# Patient Record
Sex: Male | Born: 1955 | Race: White | Hispanic: No | Smoking: Current every day smoker
Health system: Southern US, Community
[De-identification: ages and names within clinical notes are randomized; demographics above are authoritative.]

## PROBLEM LIST (undated history)

## (undated) DIAGNOSIS — N400 Enlarged prostate without lower urinary tract symptoms: Secondary | ICD-10-CM

## (undated) DIAGNOSIS — I714 Abdominal aortic aneurysm, without rupture, unspecified: Secondary | ICD-10-CM

## (undated) DIAGNOSIS — E0590 Thyrotoxicosis, unspecified without thyrotoxic crisis or storm: Secondary | ICD-10-CM

---

## 2017-06-02 ENCOUNTER — Encounter (INDEPENDENT_AMBULATORY_CARE_PROVIDER_SITE_OTHER): Payer: Self-pay

## 2017-06-02 ENCOUNTER — Ambulatory Visit (INDEPENDENT_AMBULATORY_CARE_PROVIDER_SITE_OTHER): Payer: Managed Care, Other (non HMO) | Admitting: Podiatry

## 2017-06-02 ENCOUNTER — Encounter: Payer: Self-pay | Admitting: Podiatry

## 2017-06-02 VITALS — BP 122/67 | HR 77 | Ht 72.0 in | Wt 210.0 lb

## 2017-06-02 DIAGNOSIS — G5761 Lesion of plantar nerve, right lower limb: Secondary | ICD-10-CM | POA: Diagnosis not present

## 2017-06-02 DIAGNOSIS — G5781 Other specified mononeuropathies of right lower limb: Secondary | ICD-10-CM

## 2017-06-02 NOTE — Progress Notes (Signed)
° °  Subjective:    Patient ID: Christopher Ellison, male    DOB: April 07, 1956, 61 y.o.   MRN: [id number]  HPI    Review of Systems  HENT: Positive for tinnitus.   Respiratory: Positive for cough and wheezing.   Genitourinary: Positive for difficulty urinating.  Musculoskeletal: Positive for gait problem.  Neurological: Positive for numbness.  Hematological: Bruises/bleeds easily.  All other systems reviewed and are negative.      Objective:   Physical Exam        Assessment & Plan:

## 2017-06-03 ENCOUNTER — Other Ambulatory Visit (HOSPITAL_COMMUNITY): Payer: Self-pay | Admitting: Urology

## 2017-06-03 DIAGNOSIS — R972 Elevated prostate specific antigen [PSA]: Secondary | ICD-10-CM

## 2017-06-12 ENCOUNTER — Ambulatory Visit (HOSPITAL_COMMUNITY): Payer: Self-pay

## 2017-06-15 ENCOUNTER — Ambulatory Visit (HOSPITAL_COMMUNITY)
Admission: RE | Admit: 2017-06-15 | Discharge: 2017-06-15 | Disposition: A | Discharge status: Home / Self Care | Payer: Managed Care, Other (non HMO) | Source: Ambulatory Visit | Attending: Urology | Admitting: Urology

## 2017-06-15 DIAGNOSIS — R972 Elevated prostate specific antigen [PSA]: Secondary | ICD-10-CM | POA: Diagnosis present

## 2017-06-15 DIAGNOSIS — N400 Enlarged prostate without lower urinary tract symptoms: Secondary | ICD-10-CM | POA: Insufficient documentation

## 2017-06-15 LAB — POCT I-STAT CREATININE: Creatinine, Ser: 0.8 mg/dL (ref 0.61–1.24)

## 2017-06-15 MED ORDER — LIDOCAINE HCL 2 % EX GEL
CUTANEOUS | Status: AC
  Filled 2017-06-15: qty 30

## 2017-06-15 MED ORDER — LIDOCAINE HCL 2 % EX GEL: 1.0000 "application " | Freq: Once | CUTANEOUS | Status: DC

## 2017-06-15 MED ORDER — GADOBENATE DIMEGLUMINE 529 MG/ML IV SOLN
20.0000 mL | Freq: Once | INTRAVENOUS | Status: AC | PRN
  Administered 2017-06-15: 20 mL via INTRAVENOUS

## 2017-06-21 NOTE — Progress Notes (Signed)
  Subjective:  Patient ID: Christopher Ellison, male    DOB: 09-06-1955,  MRN: [id number]  Chief Complaint  Patient presents with  . Nail Problem    Thick nails, 2nd nail on left looks ingrown. Requested for Pt to bring med list at next visit  . Callouses    callous on bottom of right foot, pt states he thinks the callous makes his 2 and 3 toes feel numb    61 y.o. male presents with the above complaint.  Planes of callus on the bottom of the right foot.  States that he feels that his second and third toes are numb.  Complains of an ingrown nail to the left second toe. No past medical history on file.  No current outpatient medications on file.  No Known Allergies Review of Systems Objective:   Vitals:   06/02/17 1538  BP: 122/67  Pulse: 77    General AA&O x3. Normal mood and affect.  Vascular Dorsalis pedis and posterior tibial pulses  present 2+ bilaterally  Capillary refill normal to all digits. Pedal hair growth normal.  Neurologic Epicritic sensation grossly intact. Positive Tinel's sign 2nd interspace right   Dermatologic No open lesions. Interspaces clear of maceration. L 2nd toenail ingrown.  Orthopedic: MMT 5/5 in dorsiflexion, plantarflexion, inversion, and eversion. Normal joint ROM without pain or crepitus. Positive Mulder's click 2nd interspace right    Assessment & Plan:  Patient was evaluated and treated and all questions answered.  Neuroma -Injection delivered R 2nd interspace -Pads dispensed.  Procedure: Neuroma Injection Location: Right 2nd interspace Skin Prep: Alcohol. Injectate: 0.5 cc 0.5% marcaine plain, 0.5 cc dexamethasone phosphate. Disposition: Patient tolerated procedure well. Injection site dressed with a band-aid.  Return in about 3 weeks (around 06/23/2017), or neuroma.

## 2017-06-30 ENCOUNTER — Ambulatory Visit (INDEPENDENT_AMBULATORY_CARE_PROVIDER_SITE_OTHER): Payer: Managed Care, Other (non HMO) | Admitting: Podiatry

## 2017-06-30 DIAGNOSIS — M2041 Other hammer toe(s) (acquired), right foot: Secondary | ICD-10-CM

## 2017-07-23 ENCOUNTER — Encounter: Payer: Self-pay | Admitting: Podiatry

## 2017-07-23 ENCOUNTER — Ambulatory Visit (INDEPENDENT_AMBULATORY_CARE_PROVIDER_SITE_OTHER): Payer: Managed Care, Other (non HMO) | Admitting: Podiatry

## 2017-07-23 DIAGNOSIS — G5761 Lesion of plantar nerve, right lower limb: Secondary | ICD-10-CM | POA: Diagnosis not present

## 2017-07-23 DIAGNOSIS — M2041 Other hammer toe(s) (acquired), right foot: Secondary | ICD-10-CM | POA: Diagnosis not present

## 2017-07-23 DIAGNOSIS — G5781 Other specified mononeuropathies of right lower limb: Secondary | ICD-10-CM

## 2017-07-23 DIAGNOSIS — M2042 Other hammer toe(s) (acquired), left foot: Secondary | ICD-10-CM

## 2017-07-25 NOTE — Progress Notes (Signed)
  Subjective:  Patient ID: Christopher Ellison, male    DOB: [birth year],  MRN: [id number]  Chief Complaint  Patient presents with  . Foot Problem    i am doing better and i do have the spacer that helps alot   61 y.o. male returns for the above complaint.  States that his feet feel a lot better.  Is using a toe spacer for his left fifth toe that he says works great.  Still reports some pain from his hammertoes.  States that on his right foot his toes sometimes go numb but is not as bad  Objective:  There were no vitals filed for this visit. General AA&O x3. Normal mood and affect.  Vascular Pedal pulses palpable.  Neurologic Epicritic sensation grossly intact.  Dermatologic No open lesions. Skin normal texture and turgor.  Orthopedic: No pain to palpation either foot.   Assessment & Plan:  Patient was evaluated and treated and all questions answered.  Neuroma right foot -Improved. -No injection today  Hammertoes Bilat -Discussed with patient that should pain ever become worse would consider surgical intervention  Follow-up PRN

## 2017-07-26 NOTE — Progress Notes (Signed)
  Subjective:  Patient ID: CHIMA ASTORINO, male    DOB: 1956/05/04,  MRN: [id number]  Chief Complaint  Patient presents with  . Callouses    callus right foot is very painful    61 y.o. male returns for the above complaint.  States his right foot is doing quite a bit.  Also reports left fifth toe pain says that the gel He was given actually makes his toe hurt worse.  Objective:  There were no vitals filed for this visit. General AA&O x3. Normal mood and affect.  Vascular Pedal pulses palpable.  Neurologic Epicritic sensation grossly intact.  Dermatologic No open lesions. Skin normal texture and turgor.  Orthopedic:  Pain to palpation right third interspace   Assessment & Plan:  Patient was evaluated and treated and all questions answered.  Neuroma R 3rd interspace. -Injection delivered as below.  Procedure: Neuroma Injection Location: Right 3rd interspace Skin Prep: Alcohol. Injectate: 0.5 cc 0.5% marcaine plain, 0.5 cc dexamethasone phosphate. Disposition: Patient tolerated procedure well. Injection site dressed with a band-aid.  5th Toe hammertoe -Discussed padding.  Discussed that should the pain persist would consider surgical intervention.  No Follow-up on file.

## 2019-05-17 DIAGNOSIS — Z01818 Encounter for other preprocedural examination: Secondary | ICD-10-CM

## 2020-12-28 ENCOUNTER — Inpatient Hospital Stay (HOSPITAL_COMMUNITY)
Admission: EM | Admit: 2020-12-28 | Discharge: 2020-12-30 | DRG: 377 | Disposition: A | Discharge status: Home / Self Care | Payer: 59 | Source: Other Acute Inpatient Hospital | Attending: Family Medicine | Admitting: Family Medicine

## 2020-12-28 ENCOUNTER — Encounter (HOSPITAL_COMMUNITY): Payer: Self-pay | Admitting: Internal Medicine

## 2020-12-28 DIAGNOSIS — Z720 Tobacco use: Secondary | ICD-10-CM | POA: Diagnosis not present

## 2020-12-28 DIAGNOSIS — K264 Chronic or unspecified duodenal ulcer with hemorrhage: Principal | ICD-10-CM | POA: Diagnosis present

## 2020-12-28 DIAGNOSIS — K5710 Diverticulosis of small intestine without perforation or abscess without bleeding: Secondary | ICD-10-CM | POA: Diagnosis present

## 2020-12-28 DIAGNOSIS — E039 Hypothyroidism, unspecified: Secondary | ICD-10-CM | POA: Diagnosis present

## 2020-12-28 DIAGNOSIS — D500 Iron deficiency anemia secondary to blood loss (chronic): Secondary | ICD-10-CM

## 2020-12-28 DIAGNOSIS — N179 Acute kidney failure, unspecified: Secondary | ICD-10-CM | POA: Diagnosis present

## 2020-12-28 DIAGNOSIS — N19 Unspecified kidney failure: Secondary | ICD-10-CM | POA: Diagnosis not present

## 2020-12-28 DIAGNOSIS — K449 Diaphragmatic hernia without obstruction or gangrene: Secondary | ICD-10-CM | POA: Diagnosis present

## 2020-12-28 DIAGNOSIS — I959 Hypotension, unspecified: Secondary | ICD-10-CM | POA: Diagnosis present

## 2020-12-28 DIAGNOSIS — U071 COVID-19: Secondary | ICD-10-CM | POA: Diagnosis present

## 2020-12-28 DIAGNOSIS — N400 Enlarged prostate without lower urinary tract symptoms: Secondary | ICD-10-CM | POA: Diagnosis present

## 2020-12-28 DIAGNOSIS — I714 Abdominal aortic aneurysm, without rupture: Secondary | ICD-10-CM | POA: Diagnosis present

## 2020-12-28 DIAGNOSIS — K922 Gastrointestinal hemorrhage, unspecified: Secondary | ICD-10-CM | POA: Diagnosis present

## 2020-12-28 DIAGNOSIS — D62 Acute posthemorrhagic anemia: Secondary | ICD-10-CM | POA: Diagnosis present

## 2020-12-28 DIAGNOSIS — Z28310 Unvaccinated for covid-19: Secondary | ICD-10-CM | POA: Diagnosis not present

## 2020-12-28 HISTORY — DX: Thyrotoxicosis, unspecified without thyrotoxic crisis or storm: E05.90

## 2020-12-28 HISTORY — DX: Benign prostatic hyperplasia without lower urinary tract symptoms: N40.0

## 2020-12-28 HISTORY — DX: Abdominal aortic aneurysm, without rupture: I71.4

## 2020-12-28 HISTORY — DX: Abdominal aortic aneurysm, without rupture, unspecified: I71.40

## 2020-12-28 LAB — HEMOGLOBIN AND HEMATOCRIT, BLOOD
HCT: 24.1 % — ABNORMAL LOW (ref 39.0–52.0)
Hemoglobin: 7.6 g/dL — ABNORMAL LOW (ref 13.0–17.0)

## 2020-12-28 MED ORDER — SODIUM CHLORIDE 0.9 % IV SOLN
8.0000 mg/h | INTRAVENOUS | Status: DC
  Administered 2020-12-29 (×2): 8 mg/h via INTRAVENOUS
  Filled 2020-12-28 (×2): qty 80

## 2020-12-28 MED ORDER — ONDANSETRON HCL 4 MG/2ML IJ SOLN: 4.0000 mg | Freq: Four times a day (QID) | INTRAMUSCULAR | Status: DC | PRN

## 2020-12-28 MED ORDER — LACTATED RINGERS IV SOLN: INTRAVENOUS | Status: DC

## 2020-12-28 MED ORDER — ACETAMINOPHEN 650 MG RE SUPP: 650.0000 mg | Freq: Four times a day (QID) | RECTAL | Status: DC | PRN

## 2020-12-28 MED ORDER — ONDANSETRON HCL 4 MG PO TABS: 4.0000 mg | ORAL_TABLET | Freq: Four times a day (QID) | ORAL | Status: DC | PRN

## 2020-12-28 MED ORDER — SODIUM CHLORIDE 0.9 % IV SOLN
80.0000 mg | Freq: Once | INTRAVENOUS | Status: AC
  Administered 2020-12-29: 80 mg via INTRAVENOUS
  Filled 2020-12-28: qty 80

## 2020-12-28 MED ORDER — ACETAMINOPHEN 325 MG PO TABS
650.0000 mg | ORAL_TABLET | Freq: Four times a day (QID) | ORAL | Status: DC | PRN
  Administered 2020-12-29 (×2): 650 mg via ORAL
  Filled 2020-12-28 (×2): qty 2

## 2020-12-28 MED ORDER — SODIUM CHLORIDE 0.9 % IV SOLN
500.0000 mg | Freq: Once | INTRAVENOUS | Status: AC
  Administered 2020-12-29: 500 mg via INTRAVENOUS
  Filled 2020-12-28: qty 10

## 2020-12-28 NOTE — H&P (Addendum)
History and Physical    Christopher Ellison AST:[id number] DOB: 04-Jun-1956 DOA: 12/28/2020  PCP: Nicoletta Dress, MD   Patient coming from:  [facility] ER in transfer  Chief Complaint: dark stools  HPI: Christopher Ellison is a 65 y.o. male with medical history significant for hypothyroidism and BPH. Presented to [facility] ER on 12/26/20 with complaint of dark black stools for 1-2 days. He reports he had initial mild abdominal pain which has resolved. He initially had low blood pressure in the 80-90/50's which resolved with IVF hydration. He was Hemoccult positive in the emergency room.  Hemoglobin was 11.5.  He had a CT angiography of the abdomen that showed no active bleeding.  He reports he has never had GI bleeding or gastric ulcers in the past.  He states he has never had a colonoscopy or upper endoscopy.  He denies any injury or trauma to his abdomen.  There is no GI coverage at [facility] so patient was transferred to [facility] for further evaluation.  Reports he has not had any change in his diet activity level.  He initially had AKI on his labs with a BUN of 112 and creatinine of 1.60.  AKI has improved in the morning of June 3 creatinine was 0.80.  Patient was incidentally found to have COVID 19 infection.  He has no symptoms.  He did not receive a COVID-vaccine.  He was not treated while at [facility] as he has no symptoms and oxygen requirement and no risk factors for worsening disease.  Denies any known exposures to COVID-19. On the evening of December 28, 2020 bed became available at [facility] patient was transferred for further evaluation by GI.   Review of Systems:  General: Denies weakness, fever, chills, weight loss, night sweats.  Denies dizziness.  Denies change in appetite HENT: Denies head trauma, headache, denies change in hearing, tinnitus.  Denies nasal congestion or bleeding.  Denies sore throat, sores in mouth.  Denies difficulty swallowing Eyes:  Denies blurry vision, pain in eye, drainage.  Denies discoloration of eyes. Neck: Denies pain.  Denies swelling.  Denies pain with movement. Cardiovascular: Denies chest pain, palpitations.  Denies edema.  Denies orthopnea Respiratory: Denies shortness of breath, cough.  Denies wheezing.  Denies sputum production Gastrointestinal: Reports melena. Reports mild abdominal pain. Denies nausea, vomiting, diarrhea. Denies hematemesis. Musculoskeletal: Denies limitation of movement.  Denies deformity or swelling.  Denies pain.  Denies arthralgias or myalgias. Genitourinary: Denies pelvic pain.  Denies urinary frequency or hesitancy.  Denies dysuria.  Skin: Denies rash.  Denies petechiae, purpura, ecchymosis. Neurological: Denies syncope.  Denies seizure activity.  Denies paresthesia.  Denies slurred speech, drooping face.  Denies visual change. Psychiatric: Denies depression, anxiety. Denies hallucinations.  History reviewed. No pertinent past medical history.  History reviewed. No pertinent surgical history.  Social History  reports that he has been smoking. He has never used smokeless tobacco. He reports that he does not drink alcohol and does not use drugs.  No Known Allergies  History reviewed. No pertinent family history.   Prior to Admission medications   Not on File    Physical Exam: Vitals:   12/28/20 2118  BP: 125/77  Pulse: 72  Resp: 16  Temp: 98.1 F (36.7 C)  TempSrc: Oral  SpO2: 98%  Weight: 99.5 kg  Height: 6' (1.829 m)    Constitutional: NAD, calm, comfortable Vitals:   12/28/20 2118  BP: 125/77  Pulse: 72  Resp: 16  Temp: 98.1 F (36.7 C)  TempSrc: Oral  SpO2: 98%  Weight: 99.5 kg  Height: 6' (1.829 m)   General: WDWN, Alert and oriented x3.  Eyes: EOMI, PERRL,  conjunctivae pale pink color.  Sclera nonicteric HENT:  [place]/AT, external ears normal.  Nares patent without epistasis.  Mucous membranes are moist. Posterior pharynx clear of any exudate or  lesions. Neck: Soft, normal range of motion, supple, no masses, no thyromegaly.  Trachea midline Respiratory: clear to auscultation bilaterally, no wheezing, no crackles. Normal respiratory effort. No accessory muscle use.  Cardiovascular: Regular rate and rhythm, no murmurs / rubs / gallops. No extremity edema. 2+ pedal pulses Abdomen: Soft, no tenderness, nondistended, no rebound or guarding.  No masses palpated.  Bowel sounds normoactive Musculoskeletal: FROM. no cyanosis. No joint deformity upper and lower extremities. Normal muscle tone.  Skin: Warm, dry, intact no rashes, lesions, ulcers. No induration Neurologic: CN 2-12 grossly intact.  Normal speech.  Sensation intact, patella DTR +1 bilaterally. Strength 5/5 in all extremities.   Psychiatric: Normal judgment and insight.  Normal mood.    Labs on Admission: I have personally reviewed following labs and imaging studies  CBC: No results for input(s): WBC, NEUTROABS, HGB, HCT, MCV, PLT in the last 168 hours.  Basic Metabolic Panel: No results for input(s): NA, K, CL, CO2, GLUCOSE, BUN, CREATININE, CALCIUM, MG, PHOS in the last 168 hours.  GFR: CrCl cannot be calculated (Patient's most recent lab result is older than the maximum 21 days allowed.).  Liver Function Tests: No results for input(s): AST, ALT, ALKPHOS, BILITOT, PROT, ALBUMIN in the last 168 hours.  Urine analysis: No results found for: COLORURINE, APPEARANCEUR, LABSPEC, PHURINE, GLUCOSEU, HGBUR, BILIRUBINUR, KETONESUR, PROTEINUR, UROBILINOGEN, NITRITE, LEUKOCYTESUR  Radiological Exams on Admission: No results found.  EKG: Independently reviewed. EKG from [facility] shows NSR with no ST elevation or depression.   Assessment/Plan Principal Problem:   Acute GI bleeding Christopher Ellison is admitted to med/surg floor.  Consult GI for evaluation and endoscopy.  Check Hgb/Hct now. If Hgb continues to descend will transfuse with PRBC. This is discussed with Christopher Ellison  and he agrees with this plan. Initial Hgb was 11.5 on 12/26/20 and dropped to 8.4 on 12/28/20.  IVF hydration with LR.  Monitor BP and vital signs.  Pt had CTA abdomen at [facility] that did not identify active bleeding.   Active Problems:   Anemia due to blood loss Secondary to GI blood loss. Monitor Hgb level.    AKI (acute kidney injury)  Has improved with IVF over past two days.  Continue IVF hydration.  Check electrolytes and renal function in am     Covid Infection Incidental finding. Pt has no symptoms. Supportive care    Hypothyroidism Chronic   DVT prophylaxis: SCDs for DVT prophylaxis.  No anticoagulation with acute GI bleeding Code Status:   Full code Family Communication:  Diagnosis and plan discussed with patient.  Patient verbalized understanding agrees with plan.  Further recommendations to follow as clinically indicated Disposition Plan:   Patient is from:  Home via [facility] emergency room transfer  Anticipated DC to:  Home  Anticipated DC date:  Anticipate 2 midnight or more stay  Anticipated DC barriers: No barriers to discharge identified at this time  Consults called:  Gastroenterology, Dr. Rush Landmark with [facility]. Secure chat sent.  Admission status:  Inpatient    Christopher Ellison Emilian Stawicki MD [facility]  How to contact the [facility] Attending or Consulting provider  7A - 7P or covering provider during after hours [place], for this patient?   1. Check the care team in [facility] and look for a) attending/consulting TRH provider listed and b) the [facility] team listed 2. Log into [email or web address] and use [facility]'s universal password to access. If you do not have the password, please contact the hospital operator. 3. Locate the [facility] provider you are looking for under [facility] and page to a number that you can be directly reached. 4. If you still have difficulty reaching the provider, please page the [facility] (Director on Call) for the Hospitalists listed  on amion for assistance.  12/28/2020, 10:16 PM     Addendum: Hgb is down to 7.6. Transfuse one unit of PRBC with GI bleeding and falling Hgb over past 24 hours.

## 2020-12-29 ENCOUNTER — Encounter (HOSPITAL_COMMUNITY): Payer: Self-pay | Admitting: Family Medicine

## 2020-12-29 ENCOUNTER — Inpatient Hospital Stay (HOSPITAL_COMMUNITY): Payer: 59 | Admitting: Certified Registered Nurse Anesthetist

## 2020-12-29 ENCOUNTER — Encounter (HOSPITAL_COMMUNITY)
Admission: EM | Disposition: A | Discharge status: Home / Self Care | Payer: Self-pay | Source: Other Acute Inpatient Hospital | Attending: Student

## 2020-12-29 ENCOUNTER — Other Ambulatory Visit: Payer: Self-pay

## 2020-12-29 DIAGNOSIS — E039 Hypothyroidism, unspecified: Secondary | ICD-10-CM

## 2020-12-29 DIAGNOSIS — N19 Unspecified kidney failure: Secondary | ICD-10-CM

## 2020-12-29 DIAGNOSIS — N179 Acute kidney failure, unspecified: Secondary | ICD-10-CM

## 2020-12-29 DIAGNOSIS — K5710 Diverticulosis of small intestine without perforation or abscess without bleeding: Secondary | ICD-10-CM

## 2020-12-29 DIAGNOSIS — D500 Iron deficiency anemia secondary to blood loss (chronic): Secondary | ICD-10-CM

## 2020-12-29 DIAGNOSIS — K264 Chronic or unspecified duodenal ulcer with hemorrhage: Secondary | ICD-10-CM

## 2020-12-29 HISTORY — PX: ESOPHAGOGASTRODUODENOSCOPY: SHX5428

## 2020-12-29 HISTORY — PX: BIOPSY: SHX5522

## 2020-12-29 LAB — BASIC METABOLIC PANEL
Anion gap: 6 (ref 5–15)
BUN: 34 mg/dL — ABNORMAL HIGH (ref 8–23)
CO2: 22 mmol/L (ref 22–32)
Calcium: 8.5 mg/dL — ABNORMAL LOW (ref 8.9–10.3)
Chloride: 110 mmol/L (ref 98–111)
Creatinine, Ser: 1.01 mg/dL (ref 0.61–1.24)
GFR, Estimated: >60 mL/min (ref >60)
Glucose, Bld: 95 mg/dL (ref 70–99)
Potassium: 3.9 mmol/L (ref 3.5–5.1)
Sodium: 138 mmol/L (ref 135–145)

## 2020-12-29 LAB — CBC
HCT: 23 % — ABNORMAL LOW (ref 39.0–52.0)
Hemoglobin: 7.4 g/dL — ABNORMAL LOW (ref 13.0–17.0)
MCH: 29.8 pg (ref 26.0–34.0)
MCHC: 32.2 g/dL (ref 30.0–36.0)
MCV: 92.7 fL (ref 80.0–100.0)
Platelets: 207 10*3/uL (ref 150–400)
RBC: 2.48 MIL/uL — ABNORMAL LOW (ref 4.22–5.81)
RDW: 13.9 % (ref 11.5–15.5)
WBC: 6.2 10*3/uL (ref 4.0–10.5)
nRBC: 0 % (ref 0.0–0.2)

## 2020-12-29 LAB — GLUCOSE, CAPILLARY: Glucose-Capillary: 95 mg/dL (ref 70–99)

## 2020-12-29 LAB — IRON AND TIBC
Iron: 54 ug/dL (ref 45–182)
Saturation Ratios: 20 % (ref 17.9–39.5)
TIBC: 267 ug/dL (ref 250–450)
UIBC: 213 ug/dL

## 2020-12-29 LAB — HEMOGLOBIN AND HEMATOCRIT, BLOOD
HCT: 22.9 % — ABNORMAL LOW (ref 39.0–52.0)
HCT: 26.5 % — ABNORMAL LOW (ref 39.0–52.0)
HCT: 27.2 % — ABNORMAL LOW (ref 39.0–52.0)
Hemoglobin: 7.3 g/dL — ABNORMAL LOW (ref 13.0–17.0)
Hemoglobin: 8.4 g/dL — ABNORMAL LOW (ref 13.0–17.0)
Hemoglobin: 8.7 g/dL — ABNORMAL LOW (ref 13.0–17.0)

## 2020-12-29 LAB — RETICULOCYTES
Immature Retic Fract: 21 % — ABNORMAL HIGH (ref 2.3–15.9)
RBC.: 2.46 MIL/uL — ABNORMAL LOW (ref 4.22–5.81)
Retic Count, Absolute: 82.2 10*3/uL (ref 19.0–186.0)
Retic Ct Pct: 3.3 % — ABNORMAL HIGH (ref 0.4–3.1)

## 2020-12-29 LAB — PREPARE RBC (CROSSMATCH)

## 2020-12-29 LAB — HIV ANTIBODY (ROUTINE TESTING W REFLEX): HIV Screen 4th Generation wRfx: NONREACTIVE

## 2020-12-29 LAB — FOLATE: Folate: 14.6 ng/mL (ref >5.9)

## 2020-12-29 LAB — VITAMIN B12: Vitamin B-12: 235 pg/mL (ref 180–914)

## 2020-12-29 LAB — FERRITIN: Ferritin: 154 ng/mL (ref 24–336)

## 2020-12-29 LAB — ABO/RH: ABO/RH(D): B POS

## 2020-12-29 SURGERY — EGD (ESOPHAGOGASTRODUODENOSCOPY)
Anesthesia: Monitor Anesthesia Care

## 2020-12-29 MED ORDER — SODIUM CHLORIDE 0.9% IV SOLUTION: Freq: Once | INTRAVENOUS | Status: AC

## 2020-12-29 MED ORDER — OXYCODONE-ACETAMINOPHEN 5-325 MG PO TABS
1.0000 | ORAL_TABLET | Freq: Once | ORAL | Status: AC
Start: 2020-12-29 — End: 2020-12-29
  Administered 2020-12-29: 1 via ORAL
  Filled 2020-12-29: qty 1

## 2020-12-29 MED ORDER — SODIUM CHLORIDE 0.9 % IV SOLN: INTRAVENOUS | Status: DC

## 2020-12-29 MED ORDER — ACETAMINOPHEN 325 MG PO TABS
650.0000 mg | ORAL_TABLET | Freq: Once | ORAL | Status: AC
  Administered 2020-12-29: 650 mg via ORAL
  Filled 2020-12-29: qty 2

## 2020-12-29 MED ORDER — LACTATED RINGERS IV SOLN
INTRAVENOUS | Status: AC | PRN
  Administered 2020-12-29: 20 mL/h via INTRAVENOUS

## 2020-12-29 MED ORDER — OXYCODONE-ACETAMINOPHEN 5-325 MG PO TABS
1.0000 | ORAL_TABLET | Freq: Three times a day (TID) | ORAL | Status: DC | PRN
  Administered 2020-12-29 – 2020-12-30 (×2): 1 via ORAL
  Filled 2020-12-29 (×2): qty 1

## 2020-12-29 MED ORDER — DIPHENHYDRAMINE HCL 25 MG PO CAPS
25.0000 mg | ORAL_CAPSULE | Freq: Once | ORAL | Status: AC
  Administered 2020-12-29: 25 mg via ORAL
  Filled 2020-12-29: qty 1

## 2020-12-29 MED ORDER — ZOLPIDEM TARTRATE 5 MG PO TABS
5.0000 mg | ORAL_TABLET | Freq: Every evening | ORAL | Status: DC | PRN
  Administered 2020-12-29: 5 mg via ORAL
  Filled 2020-12-29: qty 1

## 2020-12-29 MED ORDER — PROPOFOL 500 MG/50ML IV EMUL
INTRAVENOUS | Status: DC | PRN
  Administered 2020-12-29: 100 ug/kg/min via INTRAVENOUS

## 2020-12-29 MED ORDER — PANTOPRAZOLE SODIUM 40 MG PO TBEC
40.0000 mg | DELAYED_RELEASE_TABLET | Freq: Two times a day (BID) | ORAL | Status: DC
  Administered 2020-12-29 – 2020-12-30 (×3): 40 mg via ORAL
  Filled 2020-12-29 (×3): qty 1

## 2020-12-29 MED ORDER — PROPOFOL 10 MG/ML IV BOLUS
INTRAVENOUS | Status: DC | PRN
  Administered 2020-12-29: 20 mg via INTRAVENOUS
  Administered 2020-12-29: 30 mg via INTRAVENOUS
  Administered 2020-12-29 (×2): 20 mg via INTRAVENOUS

## 2020-12-29 NOTE — Anesthesia Preprocedure Evaluation (Addendum)
Anesthesia Evaluation  Patient identified by MRN, date of birth, ID band Patient awake    Reviewed: Allergy & Precautions, NPO status , Patient's Chart, lab work & pertinent test results  Airway Mallampati: II  TM Distance: >3 FB Neck ROM: Full    Dental  (+) Teeth Intact, Dental Advisory Given, Chipped,    Pulmonary Current Smoker and Patient abstained from smoking.,    Pulmonary exam normal        Cardiovascular negative cardio ROS   Rhythm:Regular Rate:Normal     Neuro/Psych negative neurological ROS  negative psych ROS   GI/Hepatic negative GI ROS, Neg liver ROS,   Endo/Other  Hyperthyroidism   Renal/GU      Musculoskeletal negative musculoskeletal ROS (+)   Abdominal Normal abdominal exam  (+)   Peds  Hematology   Anesthesia Other Findings   Reproductive/Obstetrics                            Anesthesia Physical Anesthesia Plan  ASA: II  Anesthesia Plan: MAC   Post-op Pain Management:    Induction: Intravenous  PONV Risk Score and Plan: 0 and Propofol infusion  Airway Management Planned: Natural Airway and Nasal Cannula  Additional Equipment: None  Intra-op Plan:   Post-operative Plan:   Informed Consent: I have reviewed the patients History and Physical, chart, labs and discussed the procedure including the risks, benefits and alternatives for the proposed anesthesia with the patient or authorized representative who has indicated his/her understanding and acceptance.       Plan Discussed with: CRNA  Anesthesia Plan Comments:        Anesthesia Quick Evaluation

## 2020-12-29 NOTE — Consult Note (Addendum)
Referring Provider:  Dr. Tonie Griffith Primary Care Physician:  Nicoletta Dress, MD Primary Gastroenterologist:  Althia Forts  Reason for Consultation:  GI bleed/melena  HPI: Christopher Ellison is a 65 y.o. male with medical history significant for hypothyroidism and BPH. He presented to [facility] ER on 12/26/20 with complaint of dark black stools for 1 day. He reports he had initial mild abdominal pain which has resolved. He says that Wednesday morning he woke up with abdominal pain and had a very black stool.  Went to work and had several more episodes.  Felt faint.  Went to [facility] and they sent him to the ED at [facility].  He initially had low blood pressure in the 80-90/50's, which resolved with IVF hydration. He was Hemoccult positive in the emergency room.  Hemoglobin was 11.5 grams.  He had a CT angiography of the abdomen that showed no active bleeding.  He reports he has never had GI bleeding or gastric ulcers in the past.  He states he has never had a colonoscopy or upper endoscopy.  He has been on Celebrex for the past 3 weeks for hip pain.  Has not passed any other stools since getting to the hospital.  No further abdominal pain.  No vomiting/hematemesis.    Hgb 7.4 grams here and was 11.5 grams at [place].  Iron studies normal.  Likely all ABLA due to UGI.  BUN elevated at 34.  Is on PPI gtt and was given a dose of erythromycin to clear the stomach.  Past Medical History:  Diagnosis Date  . AAA (abdominal aortic aneurysm) ([place])   . BPH (benign prostatic hyperplasia)   . Hyperthyroidism     History reviewed. No pertinent surgical history.  Prior to Admission medications   Not on File    Current Facility-Administered Medications  Medication Dose Route Frequency Provider Last Rate Last Admin  . 0.9 %  sodium chloride infusion (Manually program via Guardrails IV Fluids)   Intravenous Once Chotiner, Yevonne Aline, MD      . acetaminophen (TYLENOL) tablet 650 mg  650 mg Oral Q6H PRN  Chotiner, Yevonne Aline, MD   650 mg at 12/29/20 0016   Or  . acetaminophen (TYLENOL) suppository 650 mg  650 mg Rectal Q6H PRN Chotiner, Yevonne Aline, MD      . acetaminophen (TYLENOL) tablet 650 mg  650 mg Oral Once Chotiner, Yevonne Aline, MD      . diphenhydrAMINE (BENADRYL) capsule 25 mg  25 mg Oral Once Chotiner, Yevonne Aline, MD      . lactated ringers infusion   Intravenous Continuous Chotiner, Yevonne Aline, MD 100 mL/hr at 12/29/20 0213 New Bag at 12/29/20 5462  . ondansetron (ZOFRAN) tablet 4 mg  4 mg Oral Q6H PRN Chotiner, Yevonne Aline, MD       Or  . ondansetron (ZOFRAN) injection 4 mg  4 mg Intravenous Q6H PRN Chotiner, Yevonne Aline, MD      . pantoprazole (PROTONIX) 80 mg in sodium chloride 0.9 % 100 mL (0.8 mg/mL) infusion  8 mg/hr Intravenous Continuous Chotiner, Yevonne Aline, MD 10 mL/hr at 12/29/20 0237 8 mg/hr at 12/29/20 0237  . zolpidem (AMBIEN) tablet 5 mg  5 mg Oral QHS PRN Chotiner, Yevonne Aline, MD        Allergies as of 12/27/2020  . (No Known Allergies)    History reviewed. No pertinent family history.  Social History   Socioeconomic History  . Marital status: Widowed    Spouse name: Not  on file  . Number of children: Not on file  . Years of education: Not on file  . Highest education level: Not on file  Occupational History  . Not on file  Tobacco Use  . Smoking status: Current Every Day Smoker  . Smokeless tobacco: Never Used  . Tobacco comment: smokes 5 cigars a day  Substance and Sexual Activity  . Alcohol use: No  . Drug use: No  . Sexual activity: Never  Other Topics Concern  . Not on file  Social History Narrative  . Not on file   Social Determinants of Health   Financial Resource Strain: Not on file  Food Insecurity: Not on file  Transportation Needs: Not on file  Physical Activity: Not on file  Stress: Not on file  Social Connections: Not on file  Intimate Partner Violence: Not on file   Review of Systems: ROS is O/W negative except as mentioned in  HPI.  Physical Exam: Vital signs in last 24 hours: Temp:  [98.1 F (36.7 C)-98.2 F (36.8 C)] 98.2 F (36.8 C) (06/04 0014) Pulse Rate:  [72-81] 81 (06/04 0014) Resp:  [16-18] 18 (06/04 0014) BP: (123-125)/(71-77) 123/71 (06/04 0014) SpO2:  [98 %-99 %] 99 % (06/04 0014) Weight:  [99.5 kg] 99.5 kg (06/03 2118) Last BM Date: 12/27/20 General:  Alert, Well-developed, well-nourished, pleasant and cooperative in NAD Head:  Normocephalic and atraumatic. Eyes:  Sclera clear, no icterus.  Conjunctiva pink. Ears:  Normal auditory acuity. Mouth:  No deformity or lesions.   Lungs:  Clear throughout to auscultation.  No wheezes, crackles, or rhonchi.  Heart:  Regular rate and rhythm; no murmurs, clicks, rubs, or gallops. Abdomen:  Soft, non-distended.  BS present.  Non-tender.   Msk:  Symmetrical without gross deformities. Pulses:  Normal pulses noted. Extremities:  Without clubbing or edema. Neurologic:  Alert and oriented x 4;  grossly normal neurologically. Skin:  Intact without significant lesions or rashes. Psych:  Alert and cooperative. Normal mood and affect.  Intake/Output from previous day: 06/03 0701 - 06/04 0700 In: 771.2 [P.O.:360; I.V.:411.2] Out: 350 [Urine:350]  Lab Results: Recent Labs    12/28/20 2223 12/29/20 0336 12/29/20 0735  WBC  --  6.2  --   HGB 7.6* 7.4* 7.3*  HCT 24.1* 23.0* 22.9*  PLT  --  207  --    BMET Recent Labs    12/29/20 0336  NA 138  K 3.9  CL 110  CO2 22  GLUCOSE 95  BUN 34*  CREATININE 1.01  CALCIUM 8.5*   IMPRESSION:  *UGIB with melena:  Recent NSAID use with celebrex x 3 weeks.  Rule out ulcer disease, etc. *Normocytic anemia:  Hgb 7.4 grams here and was 11.5 grams on presentation at [facility].  Iron studies normal.  Likely all ABLA due to UGI.  BUN elevated at 34. *Incidental Covid 19 infection  PLAN: -EGD later today with Dr. Henrene Pastor. -Continue PPI gtt for now. -Monitory Hgb and transfuse prn.  Laban Emperor. Zehr  12/29/2020,  9:48 AM  GI ATTENDING  History, laboratories, x-rays reviewed.  Patient personally seen and examined.  Agree with comprehensive consultation note as outlined above.  The patient presents with hemodynamically stable but significant upper GI bleed.  Plans for urgent endoscopy today.The nature of the procedure, as well as the risks, benefits, and alternatives were carefully and thoroughly reviewed with the patient. Ample time for discussion and questions allowed. The patient understood, was satisfied, and agreed to proceed.  Jenny Reichmann  Wilford Corner., M.D. [facility] Division of Gastroenterology

## 2020-12-29 NOTE — Anesthesia Postprocedure Evaluation (Signed)
Anesthesia Post Note  Patient: Christopher Ellison  Procedure(s) Performed: ESOPHAGOGASTRODUODENOSCOPY (EGD) (N/A ) BIOPSY     Patient location during evaluation: PACU Anesthesia Type: MAC Level of consciousness: awake and alert Pain management: pain level controlled Vital Signs Assessment: post-procedure vital signs reviewed and stable Respiratory status: spontaneous breathing, nonlabored ventilation, respiratory function stable and patient connected to nasal cannula oxygen Cardiovascular status: stable and blood pressure returned to baseline Postop Assessment: no apparent nausea or vomiting Anesthetic complications: no   No complications documented.  Last Vitals:  Vitals:   12/29/20 1321 12/29/20 1326  BP: 104/78 134/78  Pulse: 75 76  Resp:  (!) 21  Temp:    SpO2: 98% 99%    Last Pain:  Vitals:   12/29/20 1313  TempSrc:   PainSc: 0-No pain                 [name]

## 2020-12-29 NOTE — Op Note (Signed)
[facility] Patient Name: Christopher Ellison Procedure Date : 12/29/2020 MRN: [id number] Attending MD: Docia Chuck. Henrene Pastor , MD Date of Birth: 10/15/1955 CSN: [id number] Age: 65 Admit Type: Inpatient Procedure:                Upper GI endoscopy with biopsies Indications:              Acute post hemorrhagic anemia, Melena. Had been                            taking NSAIDs. Significant drop in hemoglobin. He                            has not had evidence of clinical bleeding in                            several days. Providers:                Docia Chuck. Henrene Pastor, MD, Grace Isaac, RN, Ladona Ridgel, Technician, Clearnce Sorrel, CRNA Referring MD:             [facility] Medicines:                Monitored Anesthesia Care Complications:            No immediate complications. Estimated Blood Loss:     Estimated blood loss: none. Procedure:                Pre-Anesthesia Assessment:                           - Prior to the procedure, a History and Physical                            was performed, and patient medications and                            allergies were reviewed. The patient's tolerance of                            previous anesthesia was also reviewed. The risks                            and benefits of the procedure and the sedation                            options and risks were discussed with the patient.                            All questions were answered, and informed consent                            was obtained. Prior Anticoagulants: The patient has  taken no previous anticoagulant or antiplatelet                            agents. ASA Grade Assessment: II - A patient with                            mild systemic disease. After reviewing the risks                            and benefits, the patient was deemed in                            satisfactory condition to undergo the procedure.                            After obtaining informed consent, the endoscope was                            passed under direct vision. Throughout the                            procedure, the patient's blood pressure, pulse, and                            oxygen saturations were monitored continuously. The                            GIF-H190 ([id number]) Olympus gastroscope was                            introduced through the mouth, and advanced to the                            second part of duodenum. The upper GI endoscopy was                            accomplished without difficulty. The patient                            tolerated the procedure well. Scope In: Scope Out: Findings:      The esophagus was normal.      The stomach was normal save hiatal hernia. Biopsies were taken with a       cold forceps for histology to rule out Helicobacter pylori.      Multiple ulcers were found in the duodenum between the bulb and second       portion. Most of these were superficial. There was a 1 cm clean-based       ulcer without stigmata.      The examined duodenum also revealed a large diverticulum in the second       portion..      The cardia and gastric fundus were normal on retroflexion, save hiatal       hernia. Impression:               1. Recent GI bleed secondary to  duodenal ulcer                            disease as described.                           2. Incidental duodenal diverticulum                           3. Otherwise normal EGD status post antral biopsies Recommendation:           1. Pantoprazole 40 mg p.o. twice daily                           2. Follow-up biopsies (we will)                           3. Regular diet                           4. Avoid all NSAIDs. Advised                           5. Okay for discharge home in a.m. if stable                            overnight                           6. GI follow-up with our office in a couple weeks.                            We will arrange.  We should also discuss screening                            colonoscopy.                           Discussed with the patient. He was provided a copy                            of this report. Please call for questions or                            problems. We will sign off.                           . Procedure Code(s):        --- Professional ---                           [phone], Esophagogastroduodenoscopy, flexible,                            transoral; with biopsy, single or multiple Diagnosis Code(s):        --- Professional ---  K26.9, Duodenal ulcer, unspecified as acute or                            chronic, without hemorrhage or perforation                           D62, Acute posthemorrhagic anemia                           K92.1, Melena (includes Hematochezia) CPT copyright 2019 American Medical Association. All rights reserved. The codes documented in this report are preliminary and upon coder review may  be revised to meet current compliance requirements. Docia Chuck. Henrene Pastor, MD 12/29/2020 1:18:26 PM This report has been signed electronically. Number of Addenda: 0

## 2020-12-29 NOTE — Progress Notes (Signed)
Spoke with Dr. Cyndia Skeeters and MD gave order to discontinue cardiac monitoring.

## 2020-12-29 NOTE — Transfer of Care (Signed)
Immediate Anesthesia Transfer of Care Note  Patient: Christopher Ellison  Procedure(s) Performed: ESOPHAGOGASTRODUODENOSCOPY (EGD) (N/A ) BIOPSY  Patient Location: Endoscopy Unit  Anesthesia Type:MAC  Level of Consciousness: sedated  Airway & Oxygen Therapy: Patient Spontanous Breathing and Patient connected to nasal cannula oxygen  Post-op Assessment: Report given to RN and Post -op Vital signs reviewed and stable  Post vital signs: Reviewed and stable  Last Vitals:  Vitals Value Taken Time  BP    Temp    Pulse    Resp    SpO2      Last Pain:  Vitals:   12/29/20 1231  TempSrc: Oral  PainSc: 0-No pain      Patients Stated Pain Goal: 0 (09/32/35 5732)  Complications: No complications documented.

## 2020-12-29 NOTE — Progress Notes (Signed)
New Admission Note:   Arrival Method: [place] from [facility] - From home with fiance Mental Orientation:  A & O x4 Telemetry: Placed on Tele Box 5M16 - NSR Assessment: Completed Skin:  Intact IV:  Rt AC and Lt FA NSL Pain: See Assessment Tubes:  N/A Safety Measures: Safety Fall Prevention Plan has been given, discussed and signed Admission: Completed 5 MW Orientation: Patient has been orientated to the room, unit and staff.  Family:  None at bedside  Patient is from [facility] with [place].  A & O x4.  Skin is intact.  He has clothing and cell phone at the bed side.  He is aware of [employer] regarding valuables.  He is COVID positive but asymptomatic.  Airborne/Contact Precautions implemented.  Patient understands the reason for the precautions.  Orders have been reviewed and implemented. Will continue to monitor the patient. Call light has been placed within reach and bed alarm has been activated.   Earleen Reaper RN- BC, [facility] Phone number: [phone]

## 2020-12-29 NOTE — Progress Notes (Signed)
PROGRESS NOTE  MAXIM BEDEL QMG:[id number] DOB: 12-19-1955   PCP: Nicoletta Dress, MD  Patient is from: Home.  Independent at baseline.  DOA: 12/28/2020 LOS: 1  Chief complaints: Dark stool   Brief Narrative / Interim history: 65 year old M with history of hypothyroidism, BPH and osteoarthritis presenting to [facility] on 6/1 with complaints of dark black stool for 1 to 2 days, and found to have melanotic stool with hemoglobin of 11.5.  He was hypotensive to 80s/50s in ED but blood pressure improved with hydration.  He is not on blood thinner or NSAID other than Celebrex.  He also had AKI with Cr to 1.60 and BUN of 112.  Incidentally tested positive for COVID-19 as well.  He is not vaccinated.  Never had EGD or colonoscopy.    Patient arrived at [facility] on 6/3 when bed was available.  Hgb 7.6 on arrival.  Patient denies BM since 6/1.  EGD on 6/4 with duodenal ulcer disease (biopsied) and incidental duodenal diverticulum but no other significant finding.  GI recommended PPI twice daily and overnight observation before discharge on 6/5.  GI to arrange outpatient follow-up.  Subjective: Seen and examined earlier this morning before he went down for EGD.  No major events overnight of this morning.  No complaints.  He says he has not bowel movements 6/1.  He denies pain, dizziness, shortness of breath, fatigue, cough, nausea, vomiting or abdominal pain.  Objective: Vitals:   12/29/20 1249 12/29/20 1313 12/29/20 1321 12/29/20 1326  BP: 133/81 124/81 104/78 134/78  Pulse: 74 75 75 76  Resp: 16 (!) 29  (!) 21  Temp:      TempSrc:      SpO2:  99% 98% 99%  Weight:      Height:        Intake/Output Summary (Last 24 hours) at 12/29/2020 1437 Last data filed at 12/29/2020 1304 Gross per 24 hour  Intake 1707.12 ml  Output 350 ml  Net 1357.12 ml   Filed Weights   12/28/20 2118  Weight: 99.5 kg    Examination:  GENERAL: No apparent distress.  Nontoxic. HEENT: MMM.  Vision and  hearing grossly intact.  NECK: Supple.  No apparent JVD.  RESP:  No IWOB.  Fair aeration bilaterally. CVS:  RRR. Heart sounds normal.  ABD/GI/GU: BS+. Abd soft, NTND.  MSK/EXT:  Moves extremities. No apparent deformity. No edema.  SKIN: no apparent skin lesion or wound NEURO: Awake, alert and oriented appropriately.  No apparent focal neuro deficit. PSYCH: Calm. Normal affect.  Procedures:  EGD on 6/4 with duodenal ulcer disease (biopsied) and incidental duodenal diverticulum but no other significant finding.  Microbiology summarized: COVID-19 PCR positive around the hospital.  Assessment & Plan: ABLA due to upper GI bleed likely from duodenal ulcer as noted on EGD: Hgb 11.5 on arrival to [facility].  Anemia panel basically normal.  Bleeding seems to have subsided. Recent Labs    12/28/20 2223 12/29/20 0336 12/29/20 0735  HGB 7.6* 7.4* 7.3*  -Monitor H&H overnight -Protonix 40 mg twice daily -Okay for regular diet per GI -Plan for discharge in the morning if H&H stable -GI to arrange outpatient follow-up in 2 weeks and discuss screening colonoscopy as well  AKI/azotemia-likely due to hypovolemia from bleeding.  Azotemia likely due to upper GI bleed.  Resolved  Hypothyroidism -Continue home Synthroid  History of BPH-followed by urology in [place] home medications  Infrarenal abdominal aneurysms-reportedly noted on CT at [place] follow-up  Body mass index is 29.74 kg/m.         DVT prophylaxis:  SCDs Start: 12/28/20 2214  Code Status: Full code Family Communication: Patient and/or RN. Available if any question.  Level of care: Med-Surg Status is: Inpatient  Remains inpatient appropriate because:Inpatient level of care appropriate due to severity of illness for overnight monitoring of H&H   Dispo: The patient is from: Home              Anticipated d/c is to: Home              Patient currently is not medically stable to d/c.    Difficult to place patient No       Consultants:  Gastroenterology   Sch Meds:  Scheduled Meds: . pantoprazole  40 mg Oral BID   Continuous Infusions: PRN Meds:.acetaminophen **OR** acetaminophen, ondansetron **OR** ondansetron (ZOFRAN) IV, zolpidem  Antimicrobials: Anti-infectives (From admission, onward)   Start     Dose/Rate Route Frequency Ordered Stop   12/28/20 2345  erythromycin 500 mg in sodium chloride 0.9 % 100 mL IVPB        500 mg 100 mL/hr over 60 Minutes Intravenous  Once 12/28/20 2247 12/29/20 0936       I have personally reviewed the following labs and images: CBC: Recent Labs  Lab 12/28/20 2223 12/29/20 0336 12/29/20 0735  WBC  --  6.2  --   HGB 7.6* 7.4* 7.3*  HCT 24.1* 23.0* 22.9*  MCV  --  92.7  --   PLT  --  207  --    BMP &GFR Recent Labs  Lab 12/29/20 0336  NA 138  K 3.9  CL 110  CO2 22  GLUCOSE 95  BUN 34*  CREATININE 1.01  CALCIUM 8.5*   Estimated Creatinine Clearance: 90.3 mL/min (by C-G formula based on SCr of 1.01 mg/dL). Liver & Pancreas: No results for input(s): AST, ALT, ALKPHOS, BILITOT, PROT, ALBUMIN in the last 168 hours. No results for input(s): LIPASE, AMYLASE in the last 168 hours. No results for input(s): AMMONIA in the last 168 hours. Diabetic: No results for input(s): HGBA1C in the last 72 hours. Recent Labs  Lab 12/29/20 0636  GLUCAP 95   Cardiac Enzymes: No results for input(s): CKTOTAL, CKMB, CKMBINDEX, TROPONINI in the last 168 hours. No results for input(s): PROBNP in the last 8760 hours. Coagulation Profile: No results for input(s): INR, PROTIME in the last 168 hours. Thyroid Function Tests: No results for input(s): TSH, T4TOTAL, FREET4, T3FREE, THYROIDAB in the last 72 hours. Lipid Profile: No results for input(s): CHOL, HDL, LDLCALC, TRIG, CHOLHDL, LDLDIRECT in the last 72 hours. Anemia Panel: Recent Labs    12/29/20 0735 12/29/20 0746  VITAMINB12 235  --   FOLATE 14.6  --   FERRITIN 154   --   TIBC 267  --   IRON 54  --   RETICCTPCT  --  3.3*   Urine analysis: No results found for: COLORURINE, APPEARANCEUR, LABSPEC, PHURINE, GLUCOSEU, HGBUR, BILIRUBINUR, KETONESUR, PROTEINUR, UROBILINOGEN, NITRITE, LEUKOCYTESUR Sepsis Labs: Invalid input(s): PROCALCITONIN, [place]  Microbiology: No results found for this or any previous visit (from the past 240 hour(s)).  Radiology Studies: No results found.    Yeni Jiggetts T. [place]  If 7PM-7AM, please contact night-coverage [email or web address] 12/29/2020, 2:37 PM home

## 2020-12-30 ENCOUNTER — Other Ambulatory Visit: Payer: Self-pay

## 2020-12-30 DIAGNOSIS — K922 Gastrointestinal hemorrhage, unspecified: Secondary | ICD-10-CM | POA: Diagnosis not present

## 2020-12-30 LAB — TYPE AND SCREEN
ABO/RH(D): B POS
Antibody Screen: NEGATIVE
Unit division: 0

## 2020-12-30 LAB — CBC
HCT: 23.1 % — ABNORMAL LOW (ref 39.0–52.0)
Hemoglobin: 7.6 g/dL — ABNORMAL LOW (ref 13.0–17.0)
MCH: 30.3 pg (ref 26.0–34.0)
MCHC: 32.9 g/dL (ref 30.0–36.0)
MCV: 92 fL (ref 80.0–100.0)
Platelets: 204 10*3/uL (ref 150–400)
RBC: 2.51 MIL/uL — ABNORMAL LOW (ref 4.22–5.81)
RDW: 14 % (ref 11.5–15.5)
WBC: 5.7 10*3/uL (ref 4.0–10.5)
nRBC: 0 % (ref 0.0–0.2)

## 2020-12-30 LAB — HEMOGLOBIN AND HEMATOCRIT, BLOOD
HCT: 25.9 % — ABNORMAL LOW (ref 39.0–52.0)
HCT: 25.3 % — ABNORMAL LOW (ref 39.0–52.0)
Hemoglobin: 8.4 g/dL — ABNORMAL LOW (ref 13.0–17.0)
Hemoglobin: 8.4 g/dL — ABNORMAL LOW (ref 13.0–17.0)

## 2020-12-30 LAB — BPAM RBC
Blood Product Expiration Date: 202206232359
ISSUE DATE / TIME: 202206041038
Unit Type and Rh: 7300

## 2020-12-30 MED ORDER — PANTOPRAZOLE SODIUM 40 MG PO TBEC: 40.0000 mg | DELAYED_RELEASE_TABLET | Freq: Two times a day (BID) | ORAL | 0 refills | Status: AC

## 2020-12-30 NOTE — Discharge Summary (Signed)
Physician Discharge Summary  Christopher Ellison OMV:[id number] DOB: 23-Feb-1956 DOA: 12/28/2020  PCP: Nicoletta Dress, MD  Admit date: 12/28/2020 Discharge date: 12/30/2020 30 Day Unplanned Readmission Risk Score   Flowsheet Row Admission (Current) from 12/28/2020 in [facility]  30 Day Unplanned Readmission Risk Score (%) 7.1 Filed at 12/30/2020 0801     This score is the patient's risk of an unplanned readmission within 30 days of being discharged (0 -100%). The score is based on dignosis, age, lab data, medications, orders, and past utilization.   Low:  0-14.9   Medium: 15-21.9   High: 22-29.9   Extreme: 30 and above         Admitted From: Home Disposition: Home  Recommendations for Outpatient Follow-up:  1. Follow up with PCP in 1-2 weeks 2. Follow-up with GI in 2 weeks 3. Please obtain BMP/CBC in one week 4. Please follow up with your PCP on the following pending results: Unresulted Labs (From admission, onward)          Start     Ordered   12/29/20 1448  H. pylori antibody, IgG  Once,   R       Question:  Specimen collection method  Answer:  Lab=Lab collect   12/29/20 1447   12/29/20 0735  Hemoglobin and hematocrit, blood  Now then every 6 hours,   R (with TIMED occurrences)      12/29/20 [place]: None Equipment/Devices: None  Discharge Condition: Stable CODE STATUS: Full code Diet recommendation: Cardiac  Subjective: Seen and examined.  No complaints.  Initially reluctant to go home only because he has not had any bowel movement.  He otherwise did not have any abdominal pain or any other signs of GI bleeding.  He was agreeable for discharge after reassurance.  Brief/Interim Summary: 65 year old M with history of hypothyroidism, BPH and osteoarthritis initially presented to [facility] on 6/1 with complaints of dark black stool for 1 to 2 days, and found to have melanotic stool with hemoglobin of 11.5.  He was hypotensive to  80s/50s in ED but blood pressure improved with hydration.  He is not on blood thinner or NSAID other than Celebrex.  He also had AKI with Cr to 1.60 and BUN of 112.  Incidentally tested positive for COVID-19 as well.  He is not vaccinated.  Never had EGD or colonoscopy.    Patient arrived at [facility] on 6/3 when bed was available.  Hgb 7.6 on arrival.  Patient denied BM since 6/1.  He received 1 unit of PRBC transfusion on 12/29/2020. EGD on 6/4 with duodenal ulcer disease (biopsied) and incidental duodenal diverticulum but no other significant finding.  GI recommended PPI twice daily and overnight observation before discharge on 6/5.  GI to arrange outpatient follow-up in 2 weeks.  Patient's hemoglobin improved from 7.6 yesterday to 8.4 today.  No further episodes of black stool or any other evidence of bleeding.  He is medically stable and thus he is going to be discharged on twice daily Protonix.  GI will follow with him regarding H. pylori antibody testing and biopsy results.   Discharge Diagnoses:  Principal Problem:   Acute GI bleeding Active Problems:   Anemia due to blood loss   Hypothyroidism   AKI (acute kidney injury) ([place])   Duodenal ulcer hemorrhage    Discharge Instructions   Allergies as of 12/30/2020   No Known  Allergies     Medication List    TAKE these medications   dutasteride 0.5 MG capsule Commonly known as: AVODART Take 0.5 mg by mouth at bedtime.   levothyroxine 25 MCG tablet Commonly known as: SYNTHROID Take 25 mcg by mouth daily before breakfast.   levothyroxine 200 MCG tablet Commonly known as: SYNTHROID Take 200 mcg by mouth daily before breakfast.   pantoprazole 40 MG tablet Commonly known as: PROTONIX Take 1 tablet (40 mg total) by mouth 2 (two) times daily.       Follow-up Information    Nicoletta Dress, MD Follow up in 1 week(s).   Specialty: Internal Medicine Contact information: [address] [place]  [address] [phone]        Irene Shipper, MD Follow up in 2 week(s).   Specialty: Gastroenterology Contact information: [address] [address] [phone]              No Known Allergies  Consultations: GI   Procedures/Studies:  No results found.   Discharge Exam: Vitals:   12/30/20 0606 12/30/20 0958  BP: 128/80 91/64  Pulse: 64 63  Resp: 18 18  Temp: 98.5 F (36.9 C) 97.7 F (36.5 C)  SpO2: 99% 97%   Vitals:   12/29/20 1725 12/29/20 2113 12/30/20 0606 12/30/20 0958  BP: 107/61 128/62 128/80 91/64  Pulse: 63 66 64 63  Resp: 18 18 18 18   Temp: 98.1 F (36.7 C) 99.9 F (37.7 C) 98.5 F (36.9 C) 97.7 F (36.5 C)  TempSrc: Oral Oral Oral Oral  SpO2: 100% 98% 99% 97%  Weight:      Height:        General: Pt is alert, awake, not in acute distress Cardiovascular: RRR, S1/S2 +, no rubs, no gallops Respiratory: CTA bilaterally, no wheezing, no rhonchi Abdominal: Soft, NT, ND, bowel sounds + Extremities: no edema, no cyanosis    The results of significant diagnostics from this hospitalization (including imaging, microbiology, ancillary and laboratory) are listed below for reference.     Microbiology: No results found for this or any previous visit (from the past 240 hour(s)).   Labs: BNP (last 3 results) No results for input(s): BNP in the last 8760 hours. Basic Metabolic Panel: Recent Labs  Lab 12/29/20 0336  NA 138  K 3.9  CL 110  CO2 22  GLUCOSE 95  BUN 34*  CREATININE 1.01  CALCIUM 8.5*   Liver Function Tests: No results for input(s): AST, ALT, ALKPHOS, BILITOT, PROT, ALBUMIN in the last 168 hours. No results for input(s): LIPASE, AMYLASE in the last 168 hours. No results for input(s): AMMONIA in the last 168 hours. CBC: Recent Labs  Lab 12/29/20 0336 12/29/20 0735 12/29/20 1439 12/29/20 1758 12/30/20 0252 12/30/20 0843  WBC 6.2  --   --   --  5.7  --   HGB 7.4* 7.3* 8.4* 8.7* 7.6* 8.4*  HCT 23.0* 22.9*  26.5* 27.2* 23.1* 25.9*  MCV 92.7  --   --   --  92.0  --   PLT 207  --   --   --  204  --    Cardiac Enzymes: No results for input(s): CKTOTAL, CKMB, CKMBINDEX, TROPONINI in the last 168 hours. BNP: Invalid input(s): POCBNP CBG: Recent Labs  Lab 12/29/20 0636  GLUCAP 95   D-Dimer No results for input(s): DDIMER in the last 72 hours. Hgb A1c No results for input(s): HGBA1C in the last 72  hours. Lipid Profile No results for input(s): CHOL, HDL, LDLCALC, TRIG, CHOLHDL, LDLDIRECT in the last 72 hours. Thyroid function studies No results for input(s): TSH, T4TOTAL, T3FREE, THYROIDAB in the last 72 hours.  Invalid input(s): FREET3 Anemia work up Recent Labs    12/29/20 0735 12/29/20 0746  VITAMINB12 235  --   FOLATE 14.6  --   FERRITIN 154  --   TIBC 267  --   IRON 54  --   RETICCTPCT  --  3.3*   Urinalysis No results found for: COLORURINE, APPEARANCEUR, LABSPEC, [place], GLUCOSEU, HGBUR, BILIRUBINUR, KETONESUR, PROTEINUR, UROBILINOGEN, NITRITE, LEUKOCYTESUR Sepsis Labs Invalid input(s): PROCALCITONIN,  WBC,  LACTICIDVEN Microbiology No results found for this or any previous visit (from the past 240 hour(s)).   Time coordinating discharge: Over 30 minutes  SIGNED:   Darliss Cheney, MD  [facility] 12/30/2020, 10:05 AM  If 7PM-7AM, please contact night-coverage [email or web address]

## 2020-12-30 NOTE — Progress Notes (Signed)
Patient discharged to home via private vehicle by fiance.  Escorted to exit by nurse tech.

## 2020-12-30 NOTE — Discharge Instructions (Signed)

## 2020-12-30 NOTE — Plan of Care (Signed)
  Problem: Clinical Measurements: Goal: Ability to maintain clinical measurements within normal limits will improve Outcome: Completed/Met Goal: Will remain free from infection Outcome: Completed/Met Goal: Diagnostic test results will improve Outcome: Completed/Met Goal: Respiratory complications will improve Outcome: Completed/Met Goal: Cardiovascular complication will be avoided Outcome: Completed/Met   Problem: Activity: Goal: Risk for activity intolerance will decrease Outcome: Completed/Met   Problem: Nutrition: Goal: Adequate nutrition will be maintained Outcome: Completed/Met   Problem: Coping: Goal: Level of anxiety will decrease Outcome: Completed/Met   Problem: Elimination: Goal: Will not experience complications related to bowel motility Outcome: Completed/Met Goal: Will not experience complications related to urinary retention Outcome: Completed/Met   Problem: Pain Managment: Goal: General experience of comfort will improve Outcome: Completed/Met   Problem: Safety: Goal: Ability to remain free from injury will improve Outcome: Completed/Met   Problem: Skin Integrity: Goal: Risk for impaired skin integrity will decrease Outcome: Completed/Met   Problem: Education: Goal: Knowledge of risk factors and measures for prevention of condition will improve Outcome: Completed/Met   Problem: Coping: Goal: Psychosocial and spiritual needs will be supported Outcome: Completed/Met   Problem: Respiratory: Goal: Will maintain a patent airway Outcome: Completed/Met Goal: Complications related to the disease process, condition or treatment will be avoided or minimized Outcome: Completed/Met  Discharge instructions reviewed with patient.  This included, but was not limited to, the following:  Review of hospitalization and plans for follow-up, follow-up MD appointments, when to call the MD, information on COVID and vaccinations, isolation and activity instructions,  prescriptions were also reviewed with patient.  Comprehension of instructions was ascertained via "teach-back" technique.

## 2020-12-31 ENCOUNTER — Encounter (HOSPITAL_COMMUNITY): Payer: Self-pay | Admitting: Internal Medicine

## 2020-12-31 ENCOUNTER — Encounter: Payer: Self-pay | Admitting: Internal Medicine

## 2020-12-31 LAB — H. PYLORI ANTIBODY, IGG: H Pylori IgG: 0.57 Index Value (ref 0.00–0.79)

## 2021-01-01 LAB — SURGICAL PATHOLOGY

## 2021-02-12 ENCOUNTER — Ambulatory Visit: Payer: 59 | Admitting: Internal Medicine

## 2021-04-12 ENCOUNTER — Other Ambulatory Visit: Payer: Self-pay | Admitting: *Deleted

## 2021-04-12 DIAGNOSIS — I83893 Varicose veins of bilateral lower extremities with other complications: Secondary | ICD-10-CM

## 2021-04-15 ENCOUNTER — Other Ambulatory Visit: Payer: Self-pay | Admitting: Family Medicine

## 2021-04-15 DIAGNOSIS — R972 Elevated prostate specific antigen [PSA]: Secondary | ICD-10-CM

## 2021-04-22 ENCOUNTER — Ambulatory Visit (INDEPENDENT_AMBULATORY_CARE_PROVIDER_SITE_OTHER): Payer: 59 | Admitting: Surgery

## 2021-04-22 ENCOUNTER — Other Ambulatory Visit: Payer: Self-pay

## 2021-04-22 ENCOUNTER — Ambulatory Visit (HOSPITAL_COMMUNITY)
Admission: RE | Admit: 2021-04-22 | Discharge: 2021-04-22 | Disposition: A | Discharge status: Home / Self Care | Payer: 59 | Source: Ambulatory Visit | Attending: Surgery | Admitting: Surgery

## 2021-04-22 ENCOUNTER — Encounter: Payer: Self-pay | Admitting: Surgery

## 2021-04-22 VITALS — BP 138/84 | HR 70 | Temp 98.5°F | Resp 20 | Ht 72.0 in | Wt 238.0 lb

## 2021-04-22 DIAGNOSIS — I83893 Varicose veins of bilateral lower extremities with other complications: Secondary | ICD-10-CM | POA: Insufficient documentation

## 2021-04-22 NOTE — Progress Notes (Signed)
Vascular and Vein Specialist of [facility]  Patient name: Christopher Ellison MRN: [id number] DOB: 1955/11/18 Sex: male   REQUESTING PROVIDER:    Paulino Door   REASON FOR CONSULT:    Varicose veins  HISTORY OF PRESENT ILLNESS:   Christopher Ellison is a 65 y.o. male, who comes in today for evaluation of bilateral varicose veins.  He has been dealing with these for a long time.  He underwent right saphenous vein stripping 20 to 30 years ago.  He complains of swelling in his legs as well as pain and heaviness.  He does try to keep his legs elevated.  He began wearing 20-30 knee-high compression stockings at the beginning of the year which have helped.  He denies any history of DVT.  There is a family history of varicosities.  The patient is a current smoker.  He works as a Administrator.  PAST MEDICAL HISTORY    Past Medical History:  Diagnosis Date   AAA (abdominal aortic aneurysm) (HCC)    BPH (benign prostatic hyperplasia)    Hyperthyroidism      FAMILY HISTORY   History reviewed. No pertinent family history.  SOCIAL HISTORY:   Social History   Socioeconomic History   Marital status: Widowed    Spouse name: Not on file   Number of children: Not on file   Years of education: Not on file   Highest education level: Not on file  Occupational History   Not on file  Tobacco Use   Smoking status: Every Day    Types: Cigars   Smokeless tobacco: Never   Tobacco comments:    smokes 5 cigars a day  Vaping Use   Vaping Use: Never used  Substance and Sexual Activity   Alcohol use: No   Drug use: No   Sexual activity: Never  Other Topics Concern   Not on file  Social History Narrative   Not on file   Social Determinants of Health   Financial Resource Strain: Not on file  Food Insecurity: Not on file  Transportation Needs: Not on file  Physical Activity: Not on file  Stress: Not on file  Social Connections: Not on file  Intimate  Partner Violence: Not on file    ALLERGIES:    No Known Allergies  CURRENT MEDICATIONS:    Current Outpatient Medications  Medication Sig Dispense Refill   dutasteride (AVODART) 0.5 MG capsule Take 0.5 mg by mouth at bedtime.     ferrous sulfate 325 (65 FE) MG tablet Take 325 mg by mouth 2 (two) times daily.     levothyroxine (SYNTHROID) 200 MCG tablet Take 200 mcg by mouth daily before breakfast.     levothyroxine (SYNTHROID) 25 MCG tablet Take 25 mcg by mouth daily before breakfast.     pantoprazole (PROTONIX) 40 MG tablet Take 1 tablet (40 mg total) by mouth 2 (two) times daily. 60 tablet 0   No current facility-administered medications for this visit.    REVIEW OF SYSTEMS:   [X]  denotes positive finding, [ ]  denotes negative finding Cardiac  Comments:  Chest pain or chest pressure:    Shortness of breath upon exertion:    Short of breath when lying flat:    Irregular heart rhythm:        Vascular    Pain in calf, thigh, or hip brought on by ambulation:    Pain in feet at night that wakes you up from your sleep:  Blood clot in your veins:    Leg swelling:  x       Pulmonary    Oxygen at home:    Productive cough:     Wheezing:         Neurologic    Sudden weakness in arms or legs:     Sudden numbness in arms or legs:     Sudden onset of difficulty speaking or slurred speech:    Temporary loss of vision in one eye:     Problems with dizziness:         Gastrointestinal    Blood in stool:      Vomited blood:         Genitourinary    Burning when urinating:     Blood in urine:        Psychiatric    Major depression:         Hematologic    Bleeding problems:    Problems with blood clotting too easily:        Skin    Rashes or ulcers:        Constitutional    Fever or chills:     PHYSICAL EXAM:   Vitals:   04/22/21 1106  BP: 138/84  Pulse: 70  Resp: 20  Temp: 98.5 F (36.9 C)  SpO2: 98%  Weight: 238 lb (108 kg)  Height: 6' (1.829 m)     GENERAL: The patient is a well-nourished male, in no acute distress. The vital signs are documented above. CARDIAC: There is a regular rate and rhythm.  VASCULAR: Bilateral edema with hyperpigmentation of the skin on the medial ankle with multiple large varicosities bilaterally particularly on the posterior aspect of the right calf and going up the medial right leg.  On the left leg the pattern is similar. PULMONARY: Nonlabored respirations MUSCULOSKELETAL: There are no major deformities or cyanosis. NEUROLOGIC: No focal weakness or paresthesias are detected. SKIN:  PSYCHIATRIC:    The patient has a normal affect.  STUDIES:   I have reviewed the following studies: Right:  - No evidence of deep vein thrombosis from the common femoral through the  popliteal veins.  - No evidence of superficial venous thrombosis.  - The great saphenous vein was not identified and consistent with history  of stripping.  - The small saphenous vein is not competent.  - Several perforators observed leading to tortuous varicosities.  ASSESSMENT and PLAN   CEAP class IV: The patient is having significant limitations from his varicosities including pain and swelling.  He has had some relief from compression socks (20-30 knee-high).  I evaluated his small saphenous on the right with ultrasound and it appears to be large and relatively straight.  There were numerous varicosities originating from the small saphenous.  This does have reflux on formal ultrasound imaging.  I think he would be an excellent candidate for laser ablation of the small saphenous vein followed by stab phlebectomy of the numerous varicosities around his calf and in the saphenous vein distribution in the medial thigh.  We will work towards Government social research officer.  I have also encouraged him to start wearing thigh-high compression socks.  I have him scheduled for follow-up in 3 months to evaluate the left leg which is having similar  issues.   Leia Alf, MD, FACS Vascular and Vein Specialists of [facility] [phone] Pager [phone]

## 2021-04-24 ENCOUNTER — Other Ambulatory Visit: Payer: Self-pay

## 2021-04-24 DIAGNOSIS — I83893 Varicose veins of bilateral lower extremities with other complications: Secondary | ICD-10-CM

## 2021-05-05 ENCOUNTER — Ambulatory Visit
Admission: RE | Admit: 2021-05-05 | Discharge: 2021-05-05 | Disposition: A | Discharge status: Home / Self Care | Payer: 59 | Source: Ambulatory Visit | Attending: Family Medicine | Admitting: Family Medicine

## 2021-05-05 DIAGNOSIS — R972 Elevated prostate specific antigen [PSA]: Secondary | ICD-10-CM

## 2021-05-05 MED ORDER — GADOBENATE DIMEGLUMINE 529 MG/ML IV SOLN
20.0000 mL | Freq: Once | INTRAVENOUS | Status: AC | PRN
  Administered 2021-05-05: 20 mL via INTRAVENOUS

## 2021-05-21 ENCOUNTER — Other Ambulatory Visit: Payer: Self-pay | Admitting: *Deleted

## 2021-05-21 DIAGNOSIS — I83811 Varicose veins of right lower extremities with pain: Secondary | ICD-10-CM

## 2021-05-29 ENCOUNTER — Other Ambulatory Visit: Payer: Self-pay | Admitting: *Deleted

## 2021-05-29 MED ORDER — LORAZEPAM 1 MG PO TABS: ORAL_TABLET | ORAL | 0 refills | Status: AC

## 2021-06-06 ENCOUNTER — Other Ambulatory Visit: Payer: 59 | Admitting: Surgery

## 2021-06-13 ENCOUNTER — Other Ambulatory Visit: Payer: Self-pay | Admitting: *Deleted

## 2021-06-13 DIAGNOSIS — I83811 Varicose veins of right lower extremities with pain: Secondary | ICD-10-CM

## 2021-06-24 ENCOUNTER — Ambulatory Visit: Payer: 59 | Admitting: Surgery

## 2021-06-24 ENCOUNTER — Encounter (HOSPITAL_COMMUNITY): Payer: 59

## 2021-06-25 ENCOUNTER — Other Ambulatory Visit: Payer: Self-pay | Admitting: *Deleted

## 2021-06-25 MED ORDER — LORAZEPAM 1 MG PO TABS: ORAL_TABLET | ORAL | 0 refills | Status: AC

## 2021-07-01 ENCOUNTER — Ambulatory Visit: Payer: 59 | Admitting: Surgery

## 2021-07-01 ENCOUNTER — Encounter (HOSPITAL_COMMUNITY): Payer: 59

## 2021-07-04 ENCOUNTER — Other Ambulatory Visit: Payer: Self-pay

## 2021-07-04 ENCOUNTER — Encounter: Payer: Self-pay | Admitting: Surgery

## 2021-07-04 ENCOUNTER — Ambulatory Visit (INDEPENDENT_AMBULATORY_CARE_PROVIDER_SITE_OTHER): Payer: 59 | Admitting: Surgery

## 2021-07-04 VITALS — BP 158/91 | HR 69 | Temp 98.4°F | Resp 18 | Ht 72.0 in | Wt 230.0 lb

## 2021-07-04 DIAGNOSIS — I83893 Varicose veins of bilateral lower extremities with other complications: Secondary | ICD-10-CM | POA: Diagnosis not present

## 2021-07-04 HISTORY — PX: ENDOVENOUS ABLATION SAPHENOUS VEIN W/ LASER: SUR449

## 2021-07-04 NOTE — Progress Notes (Signed)
     Laser Ablation Procedure    Date: 07/04/2021   Christopher Ellison DOB:1956/03/25  Consent signed: Yes      Surgeon: Harold Barban MD   Procedure: Laser Ablation: right Small Saphenous Vein  BP (!) 158/91 (BP Location: Right Arm, Patient Position: Sitting, Cuff Size: Large)   Pulse 69   Temp 98.4 F (36.9 C) (Temporal)   Resp 18   Ht 6' (1.829 m)   Wt 230 lb (104.3 kg)   SpO2 99%   BMI 31.19 kg/m   Tumescent Anesthesia: 700 cc 0.9% NaCl with 50 cc Lidocaine HCL 1%  and 15 cc 8.4% NaHCO3  Local Anesthesia: 4 cc Lidocaine HCL and NaHCO3 (ratio 2:1)  7 watts continuous mode     Total energy: 502 Joules    Total time: 71 seconds  Treatment Length  12 cm   Laser Fiber Ref. #  [id number]  Lot # [id number]   Stab Phlebectomy: >20 Sites: Thigh and Calf  Patient tolerated procedure well  Notes: Patient wore face mask.  All staff members wore facial masks and facial shields/goggles.  Christopher Ellison took Ativan 1 mg (1 tablet) on 07-04-2021 at 7:00 AM and 8:15 AM.    Description of Procedure:  After marking the course of the secondary varicosities, the patient was placed on the operating table in the prone position, and the right leg was prepped and draped in sterile fashion.   Local anesthetic was administered and under ultrasound guidance the saphenous vein was accessed with a micro needle and guide wire; then the mirco puncture sheath was placed.  A guide wire was inserted saphenopopliteal junction , followed by a 5 french sheath.  The position of the sheath and then the laser fiber below the junction was confirmed using the ultrasound.  Tumescent anesthesia was administered along the course of the saphenous vein using ultrasound guidance. The patient was placed in Trendelenburg position and protective laser glasses were placed on patient and staff, and the laser was fired at 7 watts continuous mode for a total of 502 joules.   For stab phlebectomies, local anesthetic was administered at  the previously marked varicosities, and tumescent anesthesia was administered around the vessels.  Greater than 20 stab wounds were made using the tip of an 11 blade. And using the vein hook, the phlebectomies were performed using a hemostat to avulse the varicosities.  Adequate hemostasis was achieved.     Steri strips were applied to the stab wounds and ABD pads and thigh high compression stockings were applied.  Ace wrap bandages were applied over the phlebectomy sites and at the top of the saphenopopliteal junction. Blood loss was less than 15 cc.  Discharge instructions reviewed with patient and hardcopy of discharge instructions given to patient to take home. The patient ambulated taken out of the operating room in wheelchair to car having tolerated the procedure well.

## 2021-07-04 NOTE — Progress Notes (Signed)
    Patient name: Christopher Ellison MRN: [id number] DOB: 1956/04/21 Sex: male  Pre-operative Diagnosis: Varicose veins, right leg Post-operative diagnosis:  Same Surgeon:  Annamarie Major Assistants:  Norberto Sorenson Procedure:   #1: Endovenous laser ablation, right small saphenous vein   #2: Stab phlebectomy, greater than 20, right leg Anesthesia: Local   Indications: This is a 65 year old gentleman with CEAP class V venous reflux in the right leg.  He has previously undergoneVein stripping in the right leg.  He has prominent varicosities along the medial upper thigh and in the medial calf.  He is here today for laser ablation and stab phlebectomy.  Procedure: The procedure was performed in the office.  In a standing position, the patient's prominent varicosities were marked with a permanent marker.  The patient was then placed supine on the table and prepped and draped in sterile fashion.  Ultrasound was used to evaluate the small saphenous vein.  It had a normal course with multiple branches from the ankle up to the calf where it connected to the popliteal vein.  I selected a site at the lower border of the gastrocnemius muscle and infiltrated the skin with 1% lidocaine.  The small saphenous vein was then cannulated under ultrasound guidance with a micropuncture needle.  A 018 wire was inserted without resistance.  Ultrasound confirmed that the wire was in the small saphenous vein.  A micropuncture sheath was placed.  Next a 035 J-wire was inserted and able to be directed into the popliteal vein.  Next the laser sheath was inserted.  It was positioned just proximal to where the vein dove down to the popliteal vein.  The laser fiber was then inserted.  It was placed proximal to the posterior deflection of the vein towards the popliteal vein.  This was confirmed with ultrasound.  The tip of the catheter was at 11 cm.  Tumescent anesthesia was then infiltrated.  Endovenous laser ablation was then performed of  the small saphenous vein on the right for a total of 12 cm at 7 W continuous.    Once this was completed, we set up for the stab phlebectomies.  1% lidocaine was used to anesthetize the skin followed by tumescent anesthesia.  Greater than 20 stabs were performed throughout the leg and the previously marked areas where his varicosities existed.  The vein was very friable.  I was unable to get multiple large segments of the vein.  There were some good segments that were removed.  I made sure to disrupt the vein or ever it was marked.  Once we are completed with a stabs, Steri-Strips were placed over the stabs and appropriate postoperative dressing was applied.  There were no complications.    [name], M.D., [facility] Vascular and Vein Specialists of [place] Office: [phone] Pager:  [phone]

## 2021-07-10 ENCOUNTER — Encounter: Payer: 59 | Admitting: Vascular Surgery

## 2021-07-10 ENCOUNTER — Encounter (HOSPITAL_COMMUNITY): Payer: 59

## 2021-07-18 ENCOUNTER — Other Ambulatory Visit: Payer: Self-pay

## 2021-07-18 ENCOUNTER — Ambulatory Visit (INDEPENDENT_AMBULATORY_CARE_PROVIDER_SITE_OTHER): Payer: 59 | Admitting: Vascular Surgery

## 2021-07-18 ENCOUNTER — Encounter: Payer: Self-pay | Admitting: Vascular Surgery

## 2021-07-18 ENCOUNTER — Ambulatory Visit (HOSPITAL_COMMUNITY)
Admission: RE | Admit: 2021-07-18 | Discharge: 2021-07-18 | Disposition: A | Discharge status: Home / Self Care | Payer: 59 | Source: Ambulatory Visit | Attending: Vascular Surgery | Admitting: Vascular Surgery

## 2021-07-18 VITALS — BP 140/78 | HR 66 | Temp 98.1°F | Resp 20 | Ht 72.0 in | Wt 230.0 lb

## 2021-07-18 DIAGNOSIS — I83811 Varicose veins of right lower extremities with pain: Secondary | ICD-10-CM | POA: Diagnosis present

## 2021-07-18 DIAGNOSIS — I83893 Varicose veins of bilateral lower extremities with other complications: Secondary | ICD-10-CM

## 2021-07-18 NOTE — Progress Notes (Signed)
Patient name: Christopher Ellison MRN: [id number] DOB: August 13, 1955 Sex: male  REASON FOR VISIT: Follow-up after endovenous laser ablation of the right small saphenous vein with greater than 20 stabs.  HPI: Christopher Ellison is a 65 y.o. male who had presented with painful varicose veins of the right lower extremity.  He failed conservative treatment.  On 07/04/2021 he underwent laser ablation of the right small saphenous vein with greater than 20 stabs by Dr. Trula Slade.  The vein was cannulated at the lower border of the gastrocnemius muscle and treated to wear the vein began to dive deep to join the popliteal vein.  Today he has no specific complaints.  He had some mild swelling.  He has been wearing his thigh-high compression stocking.  He denies chest pain or shortness of breath.  Current Outpatient Medications  Medication Sig Dispense Refill   dutasteride (AVODART) 0.5 MG capsule Take 0.5 mg by mouth at bedtime.     ferrous sulfate 325 (65 FE) MG tablet Take 325 mg by mouth 2 (two) times daily.     levothyroxine (SYNTHROID) 200 MCG tablet Take 200 mcg by mouth daily before breakfast.     levothyroxine (SYNTHROID) 25 MCG tablet Take 25 mcg by mouth daily before breakfast.     LORazepam (ATIVAN) 1 MG tablet Take 1 tablet 30 minutes prior to leaving house on day of office surgery.  Bring second tablet with you to the office. 2 tablet 0   LORazepam (ATIVAN) 1 MG tablet Take 1 tablet 30 minutes prior to leaving house on day of office surgery.  Bring second tablet with you to the office on day of office surgery. 2 tablet 0   pantoprazole (PROTONIX) 40 MG tablet Take 1 tablet (40 mg total) by mouth 2 (two) times daily. 60 tablet 0   No current facility-administered medications for this visit.   REVIEW OF SYSTEMS: Valu.Nieves ] denotes positive finding; [  ] denotes negative finding  CARDIOVASCULAR:  [ ]  chest pain   [ ]  dyspnea on exertion  [ ]  leg swelling  CONSTITUTIONAL:  [ ]  fever   [ ]  chills  PHYSICAL  EXAM: Vitals:   07/18/21 1034  BP: 140/78  Pulse: 66  Resp: 20  Temp: 98.1 F (36.7 C)  SpO2: 98%  Weight: 230 lb (104.3 kg)  Height: 6' (1.829 m)   GENERAL: The patient is a well-nourished male, in no acute distress. The vital signs are documented above. CARDIOVASCULAR: There is a regular rate and rhythm. PULMONARY: There is good air exchange bilaterally without wheezing or rales. VASCULAR: He has minimal swelling in the right leg.  His stab incisions are healing nicely.  Some of the Steri-Strips have come off.  DATA:  VENOUS DUPLEX: I have independently interpreted his venous duplex scan today.  This shows no evidence of DVT.  The right small saphenous vein has been successfully closed.  There was some clot and some muscular branches in the gastroc.  MEDICAL ISSUES:  S/P LASER ABLATION RIGHT SMALL SAPHENOUS VEIN WITH GREATER THAN 20 STABS: Patient is doing well status post laser ablation of the right small saphenous vein and stab phlebectomies.  The incisions are healing nicely.  He is having no problems.  He has no evidence of DVT on his duplex and the vein is successfully closed.  He did have some clot in some muscular branches (gastrocnemius veins) and I have instructed him to take 81 mg of aspirin daily.  He not have any significant  symptoms on the left side.  He will call if he develops any new symptoms.  We will see him back as needed.  Deitra Mayo V[facility] and Vein Specialists of [place] [phone]

## 2021-07-31 DIAGNOSIS — E039 Hypothyroidism, unspecified: Secondary | ICD-10-CM | POA: Diagnosis not present

## 2021-07-31 DIAGNOSIS — I10 Essential (primary) hypertension: Secondary | ICD-10-CM | POA: Diagnosis not present

## 2021-07-31 DIAGNOSIS — E785 Hyperlipidemia, unspecified: Secondary | ICD-10-CM | POA: Diagnosis not present

## 2021-10-25 DIAGNOSIS — M25561 Pain in right knee: Secondary | ICD-10-CM | POA: Diagnosis not present

## 2021-10-29 DIAGNOSIS — E039 Hypothyroidism, unspecified: Secondary | ICD-10-CM | POA: Diagnosis not present

## 2021-10-29 DIAGNOSIS — E785 Hyperlipidemia, unspecified: Secondary | ICD-10-CM | POA: Diagnosis not present

## 2021-10-29 DIAGNOSIS — I10 Essential (primary) hypertension: Secondary | ICD-10-CM | POA: Diagnosis not present

## 2021-11-05 DIAGNOSIS — K591 Functional diarrhea: Secondary | ICD-10-CM | POA: Diagnosis not present

## 2021-11-05 DIAGNOSIS — R1110 Vomiting, unspecified: Secondary | ICD-10-CM | POA: Diagnosis not present

## 2021-11-26 DIAGNOSIS — M1711 Unilateral primary osteoarthritis, right knee: Secondary | ICD-10-CM | POA: Diagnosis not present

## 2021-12-23 DIAGNOSIS — M549 Dorsalgia, unspecified: Secondary | ICD-10-CM | POA: Diagnosis not present

## 2022-01-27 DIAGNOSIS — M1711 Unilateral primary osteoarthritis, right knee: Secondary | ICD-10-CM | POA: Diagnosis not present

## 2022-02-05 DIAGNOSIS — I10 Essential (primary) hypertension: Secondary | ICD-10-CM | POA: Diagnosis not present

## 2022-02-05 DIAGNOSIS — E785 Hyperlipidemia, unspecified: Secondary | ICD-10-CM | POA: Diagnosis not present

## 2022-02-05 DIAGNOSIS — E039 Hypothyroidism, unspecified: Secondary | ICD-10-CM | POA: Diagnosis not present

## 2022-02-25 DIAGNOSIS — M1711 Unilateral primary osteoarthritis, right knee: Secondary | ICD-10-CM | POA: Diagnosis not present

## 2022-03-04 DIAGNOSIS — M1711 Unilateral primary osteoarthritis, right knee: Secondary | ICD-10-CM | POA: Diagnosis not present

## 2022-03-05 DIAGNOSIS — M549 Dorsalgia, unspecified: Secondary | ICD-10-CM | POA: Diagnosis not present

## 2022-03-10 DIAGNOSIS — M1611 Unilateral primary osteoarthritis, right hip: Secondary | ICD-10-CM | POA: Diagnosis not present

## 2022-03-11 DIAGNOSIS — M1711 Unilateral primary osteoarthritis, right knee: Secondary | ICD-10-CM | POA: Diagnosis not present

## 2022-03-13 DIAGNOSIS — M1990 Unspecified osteoarthritis, unspecified site: Secondary | ICD-10-CM | POA: Diagnosis not present

## 2022-03-13 DIAGNOSIS — F17210 Nicotine dependence, cigarettes, uncomplicated: Secondary | ICD-10-CM | POA: Diagnosis not present

## 2022-03-13 DIAGNOSIS — K118 Other diseases of salivary glands: Secondary | ICD-10-CM | POA: Diagnosis not present

## 2022-03-13 DIAGNOSIS — Z79899 Other long term (current) drug therapy: Secondary | ICD-10-CM | POA: Diagnosis not present

## 2022-03-20 DIAGNOSIS — F17210 Nicotine dependence, cigarettes, uncomplicated: Secondary | ICD-10-CM | POA: Diagnosis not present

## 2022-03-20 DIAGNOSIS — D110 Benign neoplasm of parotid gland: Secondary | ICD-10-CM | POA: Diagnosis not present

## 2022-03-20 DIAGNOSIS — M1990 Unspecified osteoarthritis, unspecified site: Secondary | ICD-10-CM | POA: Diagnosis not present

## 2022-03-20 DIAGNOSIS — K118 Other diseases of salivary glands: Secondary | ICD-10-CM | POA: Diagnosis not present

## 2022-03-20 DIAGNOSIS — Z79899 Other long term (current) drug therapy: Secondary | ICD-10-CM | POA: Diagnosis not present

## 2022-04-03 DIAGNOSIS — K118 Other diseases of salivary glands: Secondary | ICD-10-CM | POA: Diagnosis not present

## 2022-04-03 DIAGNOSIS — F17210 Nicotine dependence, cigarettes, uncomplicated: Secondary | ICD-10-CM | POA: Diagnosis not present

## 2022-04-03 DIAGNOSIS — Z9049 Acquired absence of other specified parts of digestive tract: Secondary | ICD-10-CM | POA: Diagnosis not present

## 2022-04-03 DIAGNOSIS — Z09 Encounter for follow-up examination after completed treatment for conditions other than malignant neoplasm: Secondary | ICD-10-CM | POA: Diagnosis not present

## 2022-04-11 DIAGNOSIS — M1611 Unilateral primary osteoarthritis, right hip: Secondary | ICD-10-CM | POA: Diagnosis not present

## 2022-05-13 DIAGNOSIS — E039 Hypothyroidism, unspecified: Secondary | ICD-10-CM | POA: Diagnosis not present

## 2022-05-13 DIAGNOSIS — E785 Hyperlipidemia, unspecified: Secondary | ICD-10-CM | POA: Diagnosis not present

## 2022-05-13 DIAGNOSIS — I10 Essential (primary) hypertension: Secondary | ICD-10-CM | POA: Diagnosis not present

## 2022-09-15 DIAGNOSIS — J0100 Acute maxillary sinusitis, unspecified: Secondary | ICD-10-CM | POA: Diagnosis not present

## 2022-09-15 DIAGNOSIS — J209 Acute bronchitis, unspecified: Secondary | ICD-10-CM | POA: Diagnosis not present

## 2022-10-03 DIAGNOSIS — R0981 Nasal congestion: Secondary | ICD-10-CM | POA: Diagnosis not present

## 2022-10-03 DIAGNOSIS — J069 Acute upper respiratory infection, unspecified: Secondary | ICD-10-CM | POA: Diagnosis not present

## 2022-10-16 DIAGNOSIS — R059 Cough, unspecified: Secondary | ICD-10-CM | POA: Diagnosis not present

## 2022-10-16 DIAGNOSIS — J309 Allergic rhinitis, unspecified: Secondary | ICD-10-CM | POA: Diagnosis not present

## 2022-10-16 DIAGNOSIS — R051 Acute cough: Secondary | ICD-10-CM | POA: Diagnosis not present

## 2022-10-17 DIAGNOSIS — E349 Endocrine disorder, unspecified: Secondary | ICD-10-CM | POA: Diagnosis not present

## 2022-11-07 DIAGNOSIS — E349 Endocrine disorder, unspecified: Secondary | ICD-10-CM | POA: Diagnosis not present

## 2022-11-12 DIAGNOSIS — E039 Hypothyroidism, unspecified: Secondary | ICD-10-CM | POA: Diagnosis not present

## 2022-11-12 DIAGNOSIS — E785 Hyperlipidemia, unspecified: Secondary | ICD-10-CM | POA: Diagnosis not present

## 2022-11-12 DIAGNOSIS — I10 Essential (primary) hypertension: Secondary | ICD-10-CM | POA: Diagnosis not present

## 2022-12-04 DIAGNOSIS — R7989 Other specified abnormal findings of blood chemistry: Secondary | ICD-10-CM | POA: Diagnosis not present

## 2023-03-02 DIAGNOSIS — M1711 Unilateral primary osteoarthritis, right knee: Secondary | ICD-10-CM | POA: Diagnosis not present

## 2023-03-23 IMAGING — MR MR PROSTATE WO/W CM
12 series · 48 of 48 positions shown · IV contrast (multihance)
Comparison: Prostate MRI from 06/15/2017

CLINICAL DATA: Elevated PSA level.  Biopsy 04/06/2017 was benign.

EXAM:
MR PROSTATE WITHOUT AND WITH CONTRAST
TECHNIQUE: Multiplanar multisequence MRI images were obtained of the pelvis
centered about the prostate. Pre and post contrast images were
obtained.
CONTRAST:  20mL MULTIHANCE GADOBENATE DIMEGLUMINE 529 MG/ML IV SOLN

[Series 3: T2 · coronal · 3.0mm · 0.56mm/px · 1 of 23 slices shown (1 of 3)]
[im 1/23]
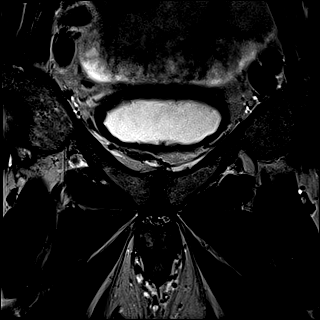

[Series 4: T1 · axial · 5.0mm · 1.25mm/px · z∈[-44,+191]mm · 2 of 96 slices shown]
[im 1/96]
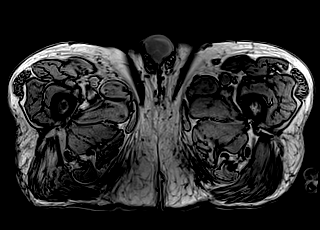
[im 96/96]
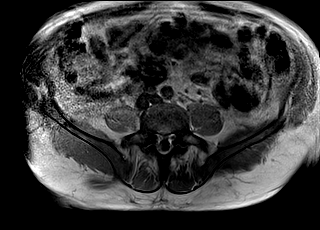

[Series 5: DWI · axial · 3.0mm · 1.75mm/px · z∈[+6,+81]mm · 2 of 78 slices shown (1 of 3)]
[im 1/78]
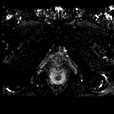
[im 78/78]
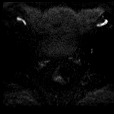

[Series 6: DWI · axial · 3.0mm · 1.75mm/px · 1 of 26 slices shown (2 of 3)]
[im 1/26]
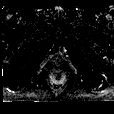

[Series 7: DWI · axial · 3.0mm · 1.75mm/px · 1 of 26 slices shown (3 of 3)]
[im 1/26]
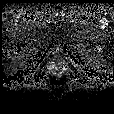

[Series 8: T2 · axial · 3.0mm · 0.56mm/px · 1 of 25 slices shown (2 of 3)]
[im 1/25]
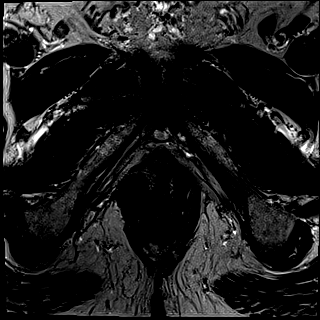

[Series 9: T2 · axial · 1.0mm · 1.04mm/px · z∈[+8,+79]mm · 2 of 72 slices shown (3 of 3)]
[im 1/72]
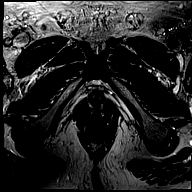
[im 72/72]
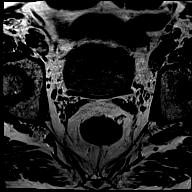

[Series 10: pre t1_twist_tra_dyn · axial · non-contrast · 3.5mm · 0.83mm/px · 1 of 20 slices shown]
[im 1/20]
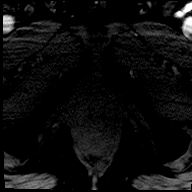

[Series 11: post t1_twist_tra_dyn-copy center · axial · non-contrast · 3.5mm · 0.83mm/px · z∈[+10,+76]mm · 17 of 600 slices shown]
[im 1/600]
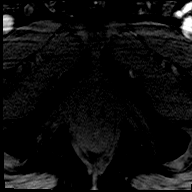
[im 38/600]
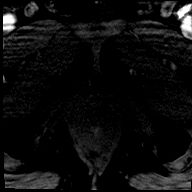
[im 75/600]
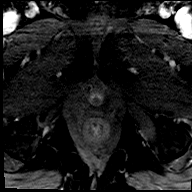
[im 113/600]
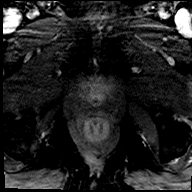
[im 150/600]
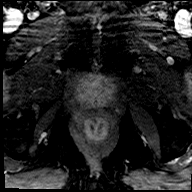
[im 188/600]
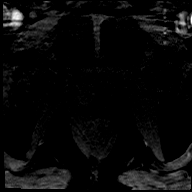
[im 225/600]
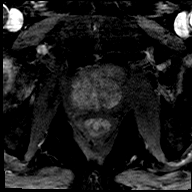
[im 263/600]
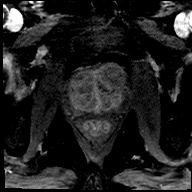
[im 300/600]
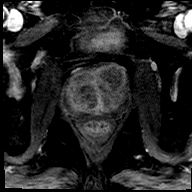
[im 337/600]
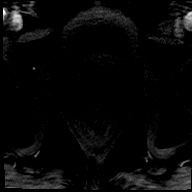
[im 375/600]
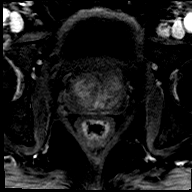
[im 412/600]
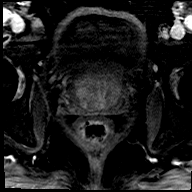
[im 450/600]
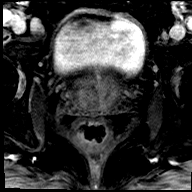
[im 487/600]
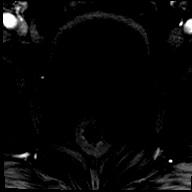
[im 525/600]
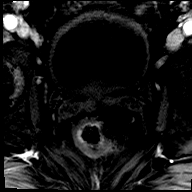
[im 562/600]
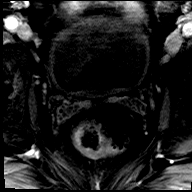
[im 600/600]
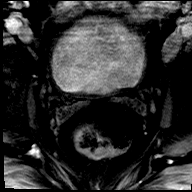

[Series 12: post t1_twist_tra_dyn-copy cent_sub · axial · 3.5mm · 0.83mm/px · z∈[+10,+76]mm · 16 of 579 slices shown]
[im 1/579]
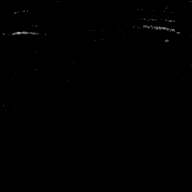
[im 39/579]
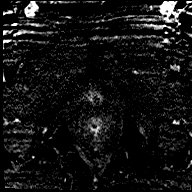
[im 78/579]
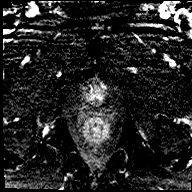
[im 116/579]
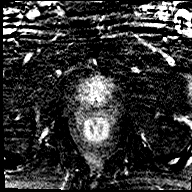
[im 155/579]
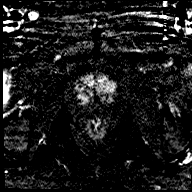
[im 193/579]
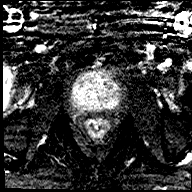
[im 232/579]
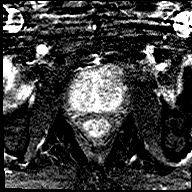
[im 270/579]
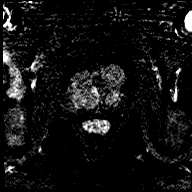
[im 309/579]
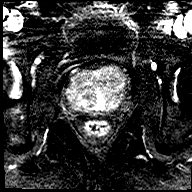
[im 347/579]
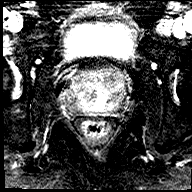
[im 386/579]
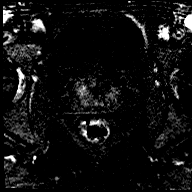
[im 424/579]
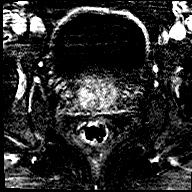
[im 463/579]
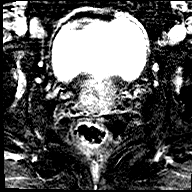
[im 501/579]
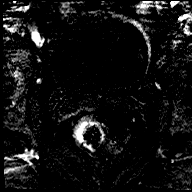
[im 540/579]
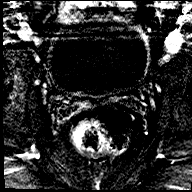
[im 579/579]
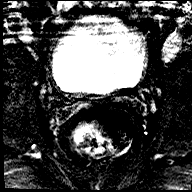

[Series 13: t1_vibe_dixon_tra_f · axial · 2.5mm · 0.91mm/px · z∈[-25,+173]mm · 2 of 80 slices shown]
[im 1/80]
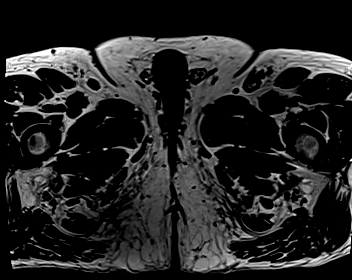
[im 80/80]
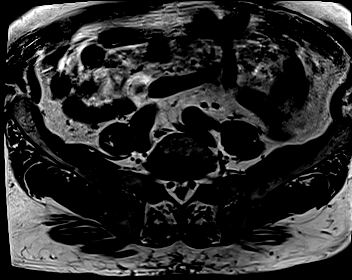

[Series 14: t1_vibe_dixon_tra_w · axial · 2.5mm · 0.91mm/px · z∈[-25,+173]mm · 2 of 80 slices shown]
[im 1/80]
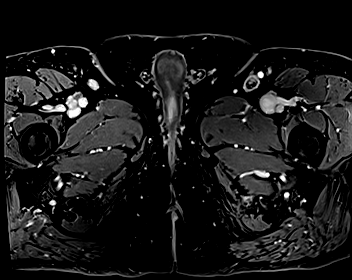
[im 80/80]
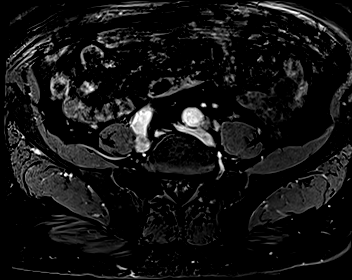

[48 of 48 positions shown; findings below may reference images not displayed]

FINDINGS: Prostate:

Encapsulated nodularity in the transition zone compatible with
benign prostatic hypertrophy.

Region of interest # 1: PI-RADS category 3 lesion of the right
posterolateral peripheral zone at the apex, with reduced T2 signal
but no substantial focal early enhancement or restricted diffusion.
This measures 0.26 cc (1.0 by 0.5 by 0.9 cm) and is shown for
example on image 56 series 9 and image 61 series 9.

Volume: 3D volumetric assessment: Prostate volume 71.02 cc (5.9 by
4.4 by 5.3 cm).

Transcapsular spread:  Absent

Seminal vesicle involvement: Absent

Neurovascular bundle involvement: Absent

Pelvic adenopathy: Absent

Bone metastasis: Absent

Other findings: Left common iliac artery aneurysm 3.1 cm in diameter
with marginal thrombus, similar to the 12/26/2020 angiogram. Similar
appearance of dilated common femoral arteries with occluded
superficial femoral arteries bilaterally but patent profundal
branches.
IMPRESSION: 1. Small PI-RADS category 3 lesion of the right posterolateral
peripheral zone at the apex. Targeting data sent to [facility].
2. Prostatomegaly and benign prostatic hypertrophy.

## 2023-04-03 DIAGNOSIS — M2042 Other hammer toe(s) (acquired), left foot: Secondary | ICD-10-CM | POA: Diagnosis not present

## 2023-04-03 DIAGNOSIS — M79676 Pain in unspecified toe(s): Secondary | ICD-10-CM | POA: Diagnosis not present

## 2023-04-03 DIAGNOSIS — L84 Corns and callosities: Secondary | ICD-10-CM | POA: Diagnosis not present

## 2023-04-13 DIAGNOSIS — R7989 Other specified abnormal findings of blood chemistry: Secondary | ICD-10-CM | POA: Diagnosis not present

## 2023-04-23 ENCOUNTER — Other Ambulatory Visit: Payer: Self-pay | Admitting: Family Medicine

## 2023-04-23 DIAGNOSIS — R972 Elevated prostate specific antigen [PSA]: Secondary | ICD-10-CM

## 2023-05-14 DIAGNOSIS — I10 Essential (primary) hypertension: Secondary | ICD-10-CM | POA: Diagnosis not present

## 2023-05-14 DIAGNOSIS — E039 Hypothyroidism, unspecified: Secondary | ICD-10-CM | POA: Diagnosis not present

## 2023-05-14 DIAGNOSIS — M79652 Pain in left thigh: Secondary | ICD-10-CM | POA: Diagnosis not present

## 2023-05-14 DIAGNOSIS — E785 Hyperlipidemia, unspecified: Secondary | ICD-10-CM | POA: Diagnosis not present

## 2023-05-22 DIAGNOSIS — H25813 Combined forms of age-related cataract, bilateral: Secondary | ICD-10-CM | POA: Diagnosis not present

## 2023-05-22 DIAGNOSIS — M7741 Metatarsalgia, right foot: Secondary | ICD-10-CM | POA: Diagnosis not present

## 2023-05-22 DIAGNOSIS — M2040 Other hammer toe(s) (acquired), unspecified foot: Secondary | ICD-10-CM | POA: Diagnosis not present

## 2023-05-22 DIAGNOSIS — M79671 Pain in right foot: Secondary | ICD-10-CM | POA: Diagnosis not present

## 2023-05-22 DIAGNOSIS — L84 Corns and callosities: Secondary | ICD-10-CM | POA: Diagnosis not present

## 2023-05-22 DIAGNOSIS — G8929 Other chronic pain: Secondary | ICD-10-CM | POA: Diagnosis not present

## 2023-06-19 ENCOUNTER — Other Ambulatory Visit: Payer: 59

## 2023-07-17 ENCOUNTER — Ambulatory Visit
Admission: RE | Admit: 2023-07-17 | Discharge: 2023-07-17 | Disposition: A | Discharge status: Home / Self Care | Payer: Medicare Other | Source: Ambulatory Visit | Attending: Family Medicine | Admitting: Family Medicine

## 2023-07-17 DIAGNOSIS — R972 Elevated prostate specific antigen [PSA]: Secondary | ICD-10-CM

## 2023-07-17 MED ORDER — GADOPICLENOL 0.5 MMOL/ML IV SOLN
10.0000 mL | Freq: Once | INTRAVENOUS | Status: AC | PRN
  Administered 2023-07-17: 10 mL via INTRAVENOUS

## 2023-07-24 ENCOUNTER — Telehealth (HOSPITAL_COMMUNITY): Payer: Self-pay | Admitting: Family Medicine

## 2023-07-24 ENCOUNTER — Encounter (HOSPITAL_COMMUNITY): Payer: Self-pay | Admitting: Family Medicine

## 2023-07-30 NOTE — Telephone Encounter (Signed)
Additional note opened in error.

## 2023-11-20 DIAGNOSIS — E039 Hypothyroidism, unspecified: Secondary | ICD-10-CM | POA: Diagnosis not present

## 2023-11-20 DIAGNOSIS — I10 Essential (primary) hypertension: Secondary | ICD-10-CM | POA: Diagnosis not present

## 2023-11-20 DIAGNOSIS — E785 Hyperlipidemia, unspecified: Secondary | ICD-10-CM | POA: Diagnosis not present

## 2024-01-23 DIAGNOSIS — M62838 Other muscle spasm: Secondary | ICD-10-CM | POA: Diagnosis not present

## 2024-01-23 DIAGNOSIS — M545 Low back pain, unspecified: Secondary | ICD-10-CM | POA: Diagnosis not present

## 2024-02-18 DIAGNOSIS — S93402A Sprain of unspecified ligament of left ankle, initial encounter: Secondary | ICD-10-CM | POA: Diagnosis not present

## 2024-02-18 DIAGNOSIS — M25572 Pain in left ankle and joints of left foot: Secondary | ICD-10-CM | POA: Diagnosis not present
